# Patient Record
Sex: Male | Born: 1980 | Race: White | Hispanic: No | Marital: Married | Smoking: Current every day smoker
Health system: Southern US, Community
[De-identification: ages and names within clinical notes are randomized; demographics above are authoritative.]

## PROBLEM LIST (undated history)

## (undated) DIAGNOSIS — F1920 Other psychoactive substance dependence, uncomplicated: Secondary | ICD-10-CM

## (undated) DIAGNOSIS — F419 Anxiety disorder, unspecified: Secondary | ICD-10-CM

## (undated) HISTORY — DX: Anxiety disorder, unspecified: F41.9

---

## 2001-10-04 ENCOUNTER — Emergency Department (HOSPITAL_COMMUNITY): Admission: EM | Admit: 2001-10-04 | Discharge: 2001-10-05 | Payer: Self-pay | Admitting: Emergency Medicine

## 2002-02-06 ENCOUNTER — Emergency Department (HOSPITAL_COMMUNITY): Admission: EM | Admit: 2002-02-06 | Discharge: 2002-02-06 | Payer: Self-pay | Admitting: Emergency Medicine

## 2007-10-17 ENCOUNTER — Emergency Department (HOSPITAL_BASED_OUTPATIENT_CLINIC_OR_DEPARTMENT_OTHER): Admission: EM | Admit: 2007-10-17 | Discharge: 2007-10-17 | Payer: Self-pay | Admitting: Emergency Medicine

## 2007-12-04 ENCOUNTER — Ambulatory Visit: Payer: Self-pay | Admitting: Internal Medicine

## 2007-12-04 DIAGNOSIS — R079 Chest pain, unspecified: Secondary | ICD-10-CM | POA: Insufficient documentation

## 2007-12-04 DIAGNOSIS — F411 Generalized anxiety disorder: Secondary | ICD-10-CM | POA: Insufficient documentation

## 2007-12-04 LAB — CONVERTED CEMR LAB
Albumin: 4.5 g/dL (ref 3.5–5.2)
Alkaline Phosphatase: 56 units/L (ref 39–117)
BUN: 11 mg/dL (ref 6–23)
Bacteria, UA: NEGATIVE
Basophils Absolute: 0 10*3/uL (ref 0.0–0.1)
CRP, High Sensitivity: 1 (ref 0.00–5.00)
Crystals: NEGATIVE
Direct LDL: 137.7 mg/dL
Eosinophils Absolute: 0.1 10*3/uL (ref 0.0–0.7)
GFR calc Af Amer: 130 mL/min
GFR calc non Af Amer: 108 mL/min
HDL: 29.3 mg/dL — ABNORMAL LOW (ref >39.0)
Ketones, ur: NEGATIVE mg/dL
Leukocytes, UA: NEGATIVE
Lymphocytes Relative: 33 % (ref 12.0–46.0)
MCHC: 35.5 g/dL (ref 30.0–36.0)
MCV: 90.2 fL (ref 78.0–100.0)
Neutrophils Relative %: 58.3 % (ref 43.0–77.0)
Nitrite: NEGATIVE
Platelets: 280 10*3/uL (ref 150–400)
Potassium: 3.8 meq/L (ref 3.5–5.1)
RBC / HPF: NONE SEEN
RDW: 12.3 % (ref 11.5–14.6)
Specific Gravity, Urine: 1.02 (ref 1.000–1.03)
T3, Free: 3.3 pg/mL (ref 2.3–4.2)
Triglycerides: 247 mg/dL (ref 0–149)
Urobilinogen, UA: 0.2 (ref 0.0–1.0)
VLDL: 49 mg/dL — ABNORMAL HIGH (ref 0–40)

## 2007-12-06 ENCOUNTER — Encounter: Payer: Self-pay | Admitting: Internal Medicine

## 2007-12-07 ENCOUNTER — Encounter: Payer: Self-pay | Admitting: Internal Medicine

## 2007-12-11 ENCOUNTER — Telehealth: Payer: Self-pay | Admitting: Internal Medicine

## 2007-12-13 ENCOUNTER — Encounter: Payer: Self-pay | Admitting: Internal Medicine

## 2007-12-24 ENCOUNTER — Ambulatory Visit: Payer: Self-pay | Admitting: Internal Medicine

## 2007-12-24 DIAGNOSIS — F17200 Nicotine dependence, unspecified, uncomplicated: Secondary | ICD-10-CM | POA: Insufficient documentation

## 2009-06-17 ENCOUNTER — Ambulatory Visit: Payer: Self-pay | Admitting: Internal Medicine

## 2009-06-17 DIAGNOSIS — M79609 Pain in unspecified limb: Secondary | ICD-10-CM | POA: Insufficient documentation

## 2009-06-18 ENCOUNTER — Encounter: Payer: Self-pay | Admitting: Internal Medicine

## 2010-01-22 ENCOUNTER — Ambulatory Visit: Payer: Self-pay | Admitting: Internal Medicine

## 2010-01-22 DIAGNOSIS — E785 Hyperlipidemia, unspecified: Secondary | ICD-10-CM | POA: Insufficient documentation

## 2010-01-22 LAB — CONVERTED CEMR LAB
ALT: 21 units/L (ref 0–53)
AST: 19 units/L (ref 0–37)
BUN: 13 mg/dL (ref 6–23)
Bilirubin, Direct: 0.1 mg/dL (ref 0.0–0.3)
Calcium: 10 mg/dL (ref 8.4–10.5)
Cholesterol: 233 mg/dL — ABNORMAL HIGH (ref 0–200)
Glucose, Bld: 90 mg/dL (ref 70–99)
Indirect Bilirubin: 0.9 mg/dL (ref 0.0–0.9)
Sodium: 139 meq/L (ref 135–145)

## 2010-01-25 ENCOUNTER — Telehealth: Payer: Self-pay | Admitting: Internal Medicine

## 2010-01-25 ENCOUNTER — Encounter (INDEPENDENT_AMBULATORY_CARE_PROVIDER_SITE_OTHER): Payer: Self-pay | Admitting: *Deleted

## 2010-03-23 ENCOUNTER — Ambulatory Visit: Payer: Self-pay | Admitting: Internal Medicine

## 2010-03-23 DIAGNOSIS — L259 Unspecified contact dermatitis, unspecified cause: Secondary | ICD-10-CM | POA: Insufficient documentation

## 2010-03-23 DIAGNOSIS — G4700 Insomnia, unspecified: Secondary | ICD-10-CM | POA: Insufficient documentation

## 2010-03-23 LAB — CONVERTED CEMR LAB
BUN: 16 mg/dL (ref 6–23)
CO2: 26 meq/L (ref 19–32)
Chloride: 103 meq/L (ref 96–112)
Creatinine, Ser: 0.88 mg/dL (ref 0.40–1.50)
HDL: 31 mg/dL — ABNORMAL LOW (ref >39)
Hgb A1c MFr Bld: 5.5 % (ref <5.7)
LDL Cholesterol: 71 mg/dL (ref 0–99)

## 2010-03-26 ENCOUNTER — Encounter: Payer: Self-pay | Admitting: Internal Medicine

## 2010-05-25 NOTE — Assessment & Plan Note (Signed)
Summary: CPX/DT   Vital Signs:  Patient profile:   30 year old male Height:      71 inches Weight:      231.75 pounds O2 Sat:      96 % on Room air Temp:     98.7 degrees F oral Pulse rate:   108 / minute Pulse rhythm:   irregular Resp:     18 per minute BP sitting:   134 / 70  (right arm) Cuff size:   large  Vitals Entered By: Glendell Docker CMA (January 22, 2010 2:20 PM)  O2 Flow:  Room air CC: CPX Is Patient Diabetic? No Pain Assessment Patient in pain? no      Comments non fasting   Primary Care [place]:  Dondra Spry DO  CC:  CPX.  History of Present Illness: 30 y/o male for routine cpx lost job  stopped meds in November 2010   chronically feels anxious constant anxiety  some headaches - sometimes can be severe enough to want to lay down occ feels flushed occ loose 1 caffeinated drinks  no hx of trauma    good response to sertraline in the past occ got mood swings. 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    Physical Exam  General:  alert, well-developed, and well-nourished.   Head:  normocephalic and atraumatic.   Eyes:  pupils equal, pupils round, and pupils reactive to light.     Ears:  R ear normal and L ear normal.   Mouth:  good dentition and pharynx pink and moist.   Neck:  supple, no masses, and no carotid bruits.   Lungs:  normal respiratory effort and normal breath sounds.   Heart:  normal rate, regular rhythm, and no gallop.   Abdomen:  soft, non-tender, normal bowel sounds, no hepatomegaly, and no splenomegaly.   Extremities:  No lower extremity edema  Psych:  normally interactive, good eye contact, not anxious appearing, and not depressed appearing.     Impression & Recommendations:  Problem # 1:  PREVENTIVE HEALTH CARE (ICD-V70.0) Reviewed adult health maintenance protocols.  Td Booster: given (11/29/2004)   Flu Vax: Declined (01/22/2010)   Chol: 210 (12/04/2007)   HDL: 29.3 (12/04/2007)   LDL: DEL (12/04/2007)   TG: 247 (12/04/2007) TSH: 1.82 (12/04/2007)     Problem # 2:  ANXIETY DISORDER (ICD-300.00)  Orders: T-Basic Metabolic Panel [id number]) T-TSH [phone]) T-T4, Free [phone]) T- * Misc. Laboratory test [id number]) Psychology Referral (Psychology)  His updated medication list for this problem includes:    Amitriptyline Hcl 10 Mg Tabs (Amitriptyline hcl) ..... One to two tabs by mouth at bedtime as needed for sleep  Complete Medication List: 1)  Amitriptyline Hcl  10 Mg Tabs (Amitriptyline hcl) .... One to two tabs by mouth at bedtime as needed for sleep  Other Orders: T-Lipid Profile ([id number]) T-Hepatic Function [id number])  Patient Instructions: 1)  Please schedule a follow-up appointment in 2 months. 2)  Take fish oil capsule two times a day  3)  Try taking melatonin as directed for insomnia 4)  Also try taking amitriptyline for insomnia Prescriptions: AMITRIPTYLINE HCL 10 MG TABS (AMITRIPTYLINE HCL) one to two tabs by mouth at bedtime as needed for sleep  #60 x 2   Entered and Authorized by:   D. Thomos Lemons DO   Signed by:   D. Thomos Lemons DO on 01/22/2010   Method used:   Electronically to        [id number]* (retail)       [address]       [place], [place]  [address]       Ph: [phone]       Fax: [phone]   RxID:   [id number]    Immunization History:  Influenza Immunization History:    Influenza:  declined (01/22/2010)   Contraindications/Deferment of Procedures/Staging:    Test/Procedure: FLU VAX    Reason for deferment: patient declined   Current Allergies (reviewed today): No known allergies

## 2010-05-25 NOTE — Letter (Signed)
   [place] at [facility] [address] [place], [place]  [address]  [place] Phone: [phone]      March 26, 2010   Jordan Pugh [address] [place], [place] [address]  RE:  LAB RESULTS  Dear  [name],  The following is an interpretation of your most recent lab tests.  Please take note of any instructions provided or changes to medications that have resulted from your lab work.  ELECTROLYTES:  Good - no changes needed  KIDNEY FUNCTION TESTS:  Good - no changes needed  LIPID PANEL:  Fair - review at your next visit Triglyceride: 355   Cholesterol: 173   LDL: 71   HDL: 31   Chol/HDL%:  5.6 Ratio   DIABETIC STUDIES:  Good - no changes needed Blood Glucose: 101   HgbA1C: 5.5     CRP - 1.3 (normal)   Your triglycerides are still elevated.    Please avoid fried foods (fast food) and refined sugars (especially high fructose corn syrup).  Regular exercise can also help lower triglyceride levels.     Sincerely Yours,    Dr. Thomos Lemons  Appended Document:  mailed

## 2010-05-25 NOTE — Letter (Signed)
Summary: Primary Care Consult Scheduled Letter  [place] at [facility]  [address]   [place], [place] [address]   Phone: [phone]  Fax: [phone]      01/25/2010 MRN: [id number]  HAKAN Chrisley [address] [place], [place]  [address]    Dear Mr. Gielow,      We have scheduled an appointment for you.  At the recommendation of Dr.YOO, we have scheduled you a consult with [facility]  on _[address]  1 2011 at 4:30PM.  Their address [phone], [place] . The office phone number is [phone].  If this appointment day and time is not convenient for you, please feel free to call the office of the doctor you are being referred to at the number listed above and reschedule the appointment.     It is important for you to keep your scheduled appointments. We are here to make sure you are given good patient care.    Thank you,  Darral Dash Patient Care Coordinator [place] at [facility]

## 2010-05-25 NOTE — Consult Note (Signed)
Summary: [facility]  [facility]   Imported By: Lanelle Bal 07/01/2009 13:52:52  _____________________________________________________________________  External Attachment:    Type:   Image     Comment:   External Document

## 2010-05-25 NOTE — Assessment & Plan Note (Signed)
Summary: 2 month fu/dt   Vital Signs:  Patient profile:   30 year old male Height:      71 inches Weight:      227 pounds BMI:     31.77 O2 Sat:      97 % on Room air Temp:     98.3 degrees F oral Pulse rate:   96 / minute Resp:     18 per minute BP sitting:   132 / 80  (right arm) Cuff size:   large  Vitals Entered By: Glendell Docker CMA (March 23, 2010 9:07 AM)  O2 Flow:  Room air CC: 2 month follow up  Is Patient Diabetic? No Pain Assessment Patient in pain? no      Comments fasting for blood work, no concerns   Primary Care [place]:  D. Thomos Lemons DO  CC:  2 month follow up .  History of Present Illness:  Hyperlipidemia Follow-Up      This is a 30 year old man who presents for Hyperlipidemia follow-up.  The patient denies the following symptoms: chest pain/pressure.  Dietary compliance has been good and fair.  Adjunctive measures currently used by the patient include fish oil supplements.    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   Neck:  supple, no masses, and no carotid bruits.   Lungs:  normal respiratory effort and normal breath sounds.   Heart:  normal rate, regular rhythm, and no gallop.     Impression & Recommendations:  Problem # 1:  DYSLIPIDEMIA (ICD-272.4) repeat lipids. continue dietary / lifestyle changes rule out diabetes thyroid function normal no regular alcohol use  Labs Reviewed: SGOT: 19 (01/22/2010)   SGPT: 21 (01/22/2010)   HDL:30  (01/22/2010), 29.3 (12/04/2007)  LDL:See Comment mg/dL (16/01/9603), DEL (54/12/8117)  Chol:233 (01/22/2010), 210 (12/04/2007)  Trig:836 (01/22/2010), 247 (12/04/2007)  Problem # 2:  INSOMNIA, CHRONIC (ICD-307.42) Assessment: Improved good response to amitriptyline  he has been take two 10 mg tabs switch to 25 mg  Complete Medication List: 1)  Amitriptyline Hcl 25 Mg Tabs (Amitriptyline hcl) .... One by mouth at bedtime prn 2)  Fish Oil 1000 Mg Caps (Omega-3 fatty acids) .... One by mouth two times a day  Other Orders: T-Basic Metabolic Panel [id number]) T-Lipid Profile [id number]) CRP, high sensitivity-[facility] [phone]) T- Hemoglobin A1C ([id number])  Patient Instructions: 1)  Please schedule a follow-up appointment in 6 months. 2)  Take fish oil caps as directed Prescriptions: AMITRIPTYLINE HCL 25 MG TABS (AMITRIPTYLINE HCL) one by mouth at bedtime prn  #30 x 5   Entered and Authorized by:   D. Thomos Lemons DO   Signed by:   D. Thomos Lemons DO on 03/23/2010   Method used:   Electronically to        [facility] [phone]* (retail)       [address]       [place], [place]  [address]  Ph: [phone]       Fax: [phone]   RxID:   [id number]    Orders Added: 1)  T-Basic Metabolic Panel [phone] 2)  T-Lipid Profile [[id number]] 3)  CRP, high sensitivity-[facility] [id number] 4)  T- Hemoglobin A1C [[id number]] 5)  Est. 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 Thomos Lemons DO;  Authorized by: D. Thomos Lemons DO;  Method used: Electronically to [facility] [phone]*, [address] [place], [place], [place]  [address], Ph: [phone], Fax: [phone]    Prescriptions: CLOTRIMAZOLE-BETAMETHASONE 1-0.05 % CREA (CLOTRIMAZOLE-BETAMETHASONE) apply two times a day to hands x 2 weeks  #30 grams x 1   Entered and Authorized by:   D. Thomos Lemons DO   Signed by:   D. Thomos Lemons DO on 03/23/2010   Method used:   Electronically to        [id number]* (retail)       [address]       [place], [place]  [address]       Ph: [phone]       Fax: [phone]   RxID:   [id number]    Current Allergies: No known allergies

## 2010-05-25 NOTE — Letter (Signed)
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 [address]   [place], [place] [address]   Phone: [phone]  Fax: [phone]      June 17, 2009     Employee:  Jordan Pugh    To Whom It May Concern:   For Medical reasons, please excuse the above named employee from work for the following dates:  Start:   June 17, 2009  End:   June 21, 2009  He may resume normal work schedule on Monday June 22, 2009.If you need additional information, please feel free to contact our office.           Sincerely,    Glendell Docker CMA Dr. Thomos Lemons

## 2010-05-25 NOTE — Progress Notes (Signed)
Summary: Lab Work  Programme researcher, broadcasting/film/video of Call: call pt - was he fasting for recent blood test? Initial call taken by: D. Thomos Lemons DO,  January 25, 2010 1:19 PM  Follow-up for Phone Call        call placed to patient at [phone], no answer. A detailed voice message was left for patient to return call to inform if he was fasting for his blood work Follow-up by: Glendell Docker CMA,  January 25, 2010 4:58 PM  Additional Follow-up for Phone Call Additional follow up Details #1::        patient returned call and left voice message stating he last ate at 9am prior to blood draw Additional Follow-up by: Glendell Docker CMA,  January 26, 2010 8:48 AM    Additional Follow-up for Phone Call Additional follow up Details #2::    we need to repeat FLP.  plz make sure pt fasting 10 hrs before blood test also add High sensitivity CRP :  272.4  Follow-up by: D. Thomos Lemons DO,  January 26, 2010 2:42 PM  Additional Follow-up for Phone Call Additional follow up Details #3:: Details for Additional Follow-up Action Taken: call placed to patient at [phone], no answer. A detailed voice message was left informing patient per Dr Artist Pais instructions. He was advised to call back to inform what day he will go for the lab draw Additional Follow-up by: Glendell Docker CMA,  January 26, 2010 3:01 PM

## 2010-05-25 NOTE — Assessment & Plan Note (Signed)
Summary: PAIN IN RIGHT ARM/MHF   Vital Signs:  Patient profile:   30 year old male Weight:      215.25 pounds BMI:     30.13 O2 Sat:      100 % on 0.25 L/min Temp:     98.0 degrees F Pulse rate:   70 / minute Pulse rhythm:   regular Resp:     16 per minute BP sitting:   124 / 80  (left arm) Cuff size:   large  Vitals Entered By: Glendell Docker CMA (June 17, 2009 10:26 AM)  O2 Flow:  0.25 L/min CC: RM 4-RIght arm pain Pain Assessment Patient in pain? yes     Location: arm Intensity: 6 Type: stinging Onset of pain  With activity Comments patient states he has had right arm pain that started last Friday. He states the pain radiates up and down arm, and up to neck and jaw, pain is dependent upon movement, dull ache-stinging stabbing sensation with movement   Primary Care Provider:  Dondra Spry DO  CC:  RM 4-RIght arm pain.  History of Present Illness: onset - 5 days ago.  started out dull pain in forearm.  got worse.  sharp stabbing pain upper arm and neck. no fall injury.  lifts ladders daily  Allergies (verified): No Known Drug Allergies  Past History:  Past Medical History: Childhood asthma Panic disorder/anxiety   Family History: Father died at age 58 due to myocardial infarction.  Father is known to abuse cocaine and alcohol Mother is 8 and healthy. Grandfather died of COPD and lung tumor    Social History: Occupation: Engineer, maintenance (IT) Married - no children  Current Smoker  (currently smokes one half pack per day.  He has been smoking for 12 years) Alcohol use-yes  (social) 2 to 3 caffeinated beverages per day   Physical Exam  General:  alert, well-developed, and well-nourished.   Lungs:  normal respiratory effort and normal breath sounds.   Heart:  normal rate, regular rhythm, and no gallop.   Msk:  right shoulder AC joint tenderness.  limited abduction of rt arm Neurologic:  cranial nerves II-XII intact and DTRs symmetrical and  normal.     Impression & Recommendations:  Problem # 1:  ARM PAIN, RIGHT (ICD-729.5) Pt with progressive right arm pain.  difficult to discern from exam whether left arm weakness from pain response vs cervical radiculopathy.  refer to ortho for further eval.  defer MRI of Neck to ortho.   continue NSAIDs OTC.  use skelaxin.  out of work - no lifting until cleared ortho Orders: Orthopedic Referral (Ortho)  Complete Medication List: 1)  Metaxalone 800 Mg Tabs (Metaxalone) .... One by mouth three times a day as needed  Patient Instructions: 1)  Please schedule a follow-up appointment in 1 week. Prescriptions: METAXALONE 800 MG TABS (METAXALONE) one by mouth three times a day as needed  #21 x 0   Entered and Authorized by:   D. Thomos Lemons DO   Signed by:   D. Thomos Lemons DO on 06/17/2009   Method used:   Electronically to        [facility]  [address] [address] [phone]* (retail)       [address]       [place], [place]  [address]       Ph: [phone] or [phone]       Fax: [phone]   RxID:  [id number]    Orders Added: 1)  Orthopedic Referral [Ortho] 2)  Est. Patient Level III [[address]]   Current Allergies (reviewed today): No known allergies    Immunization History:  Influenza Immunization History:    Influenza:  declined (06/17/2009)   Contraindications/Deferment of Procedures/Staging:    Test/Procedure: FLU VAX    Reason for deferment: patient declined

## 2010-06-25 ENCOUNTER — Encounter: Payer: Self-pay | Admitting: Internal Medicine

## 2010-08-16 ENCOUNTER — Ambulatory Visit (INDEPENDENT_AMBULATORY_CARE_PROVIDER_SITE_OTHER): Payer: 59 | Admitting: Family

## 2010-08-16 DIAGNOSIS — J329 Chronic sinusitis, unspecified: Secondary | ICD-10-CM | POA: Insufficient documentation

## 2010-08-16 MED ORDER — AMOXICILLIN 875 MG PO TABS: 875.0000 mg | ORAL_TABLET | Freq: Two times a day (BID) | ORAL | Status: AC

## 2010-08-16 NOTE — Patient Instructions (Signed)

## 2010-08-16 NOTE — Assessment & Plan Note (Addendum)
Will plan to treat for sinusitis with amoxicillin. Pt instructed to call if symptoms worsen or if they do not improve.

## 2010-08-16 NOTE — Progress Notes (Signed)
  Subjective:    Patient ID: Jordan Pugh, male    DOB: [birth year], 30 y.o.   MRN: [id number]  HPI Mr.  Schoch is a 30 yr old male who presents with chief complaint of sinus pressure.  + nasal congestion- blood tinged at times.  + pressure behind the eyes.  Denies associated fever, but feels that his energy is low.  He has tried dayquil/nyquil, mucinex without much improvement.  Symptoms started 5 days ago.  + mild cough Review of Systems See HPI    Past Medical History  Diagnosis Date  . Asthma     childhood  . Anxiety     anxiety and panic disorder    History   Social History  . Marital Status: Married    Spouse Name: N/A    Number of Children: N/A  . Years of Education: N/A   Occupational History  . 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 Smoking status: Current Everyday Smoker  . Smokeless tobacco: Not on file  . Alcohol Use: Yes  . Drug Use:   . Sexually Active:    Other Topics Concern  . Not on file   Social History Narrative   Wife is expecting in May 2012Caffeine Use:  2-3 beverages daily    No past surgical history on file.  Family History  Problem Relation Age of Onset  . Alcohol abuse Father   . Drug abuse Father   . Heart disease Father     MI    Allergies not on file  Current Outpatient Prescriptions on File Prior to Visit  Medication Sig Dispense Refill  . fish oil-omega-3 fatty acids 1000 MG capsule Take 2 g by mouth 2 (two) times daily.        Marland Kitchen DISCONTD: amitriptyline (ELAVIL) 25 MG tablet Take 25 mg by mouth at bedtime as needed.        Marland Kitchen DISCONTD: clotrimazole-betamethasone (LOTRISONE) cream Apply topically 2 (two) times daily. To hands x 2 weeks.         BP 126/90  Pulse 88  Temp 97.9 F (36.6 C)  SpO2 98%    Objective:   Physical Exam  Constitutional: He appears well-developed and well-nourished.  HENT:  Head: Normocephalic and atraumatic.  Right Ear: Tympanic membrane normal.  Left Ear: Tympanic  membrane normal.  Mouth/Throat: Uvula is midline and mucous membranes are normal. No oropharyngeal exudate or posterior oropharyngeal edema.  Eyes: Conjunctivae and EOM are normal. Pupils are equal, round, and reactive to light.  Neck: Normal range of motion. Neck supple.  Cardiovascular: Normal rate and regular rhythm.   Pulmonary/Chest: Effort normal and breath sounds normal. No respiratory distress. He has no wheezes. He has no rales.  Abdominal: Soft. Bowel sounds are normal. He exhibits no distension. There is no tenderness.          Assessment & Plan:

## 2010-08-18 ENCOUNTER — Telehealth: Payer: Self-pay | Admitting: Internal Medicine

## 2010-08-18 NOTE — Telephone Encounter (Signed)
Patient states that he has an upcoming appt on 09-03-10. Patients wife is expecting baby and he would like to know if he could get a whooping cough shot at that same visit.

## 2010-08-18 NOTE — Telephone Encounter (Signed)
Call placed to patient at [phone], no answer. A detailed voice message was left for patient to call the health department to verify the type of vaccine that he is requesting. Message informed patient if TDAP was needed that could be provided at the office visit, otherwise he is to follow up with the health department for requested vaccine. He was advised to call back if any additional questions

## 2010-08-19 ENCOUNTER — Ambulatory Visit: Payer: Self-pay | Admitting: Internal Medicine

## 2010-09-02 ENCOUNTER — Encounter: Payer: Self-pay | Admitting: Internal Medicine

## 2010-09-03 ENCOUNTER — Ambulatory Visit: Payer: Self-pay | Admitting: Internal Medicine

## 2011-03-03 ENCOUNTER — Encounter (HOSPITAL_BASED_OUTPATIENT_CLINIC_OR_DEPARTMENT_OTHER): Payer: Self-pay | Admitting: Emergency Medicine

## 2011-03-03 ENCOUNTER — Emergency Department (HOSPITAL_BASED_OUTPATIENT_CLINIC_OR_DEPARTMENT_OTHER)
Admission: EM | Admit: 2011-03-03 | Discharge: 2011-03-03 | Disposition: A | Discharge status: Home / Self Care | Payer: Managed Care, Other (non HMO) | Attending: Emergency Medicine | Admitting: Emergency Medicine

## 2011-03-03 DIAGNOSIS — F17200 Nicotine dependence, unspecified, uncomplicated: Secondary | ICD-10-CM | POA: Insufficient documentation

## 2011-03-03 DIAGNOSIS — S0180XA Unspecified open wound of other part of head, initial encounter: Secondary | ICD-10-CM | POA: Insufficient documentation

## 2011-03-03 DIAGNOSIS — J45909 Unspecified asthma, uncomplicated: Secondary | ICD-10-CM | POA: Insufficient documentation

## 2011-03-03 DIAGNOSIS — X58XXXA Exposure to other specified factors, initial encounter: Secondary | ICD-10-CM | POA: Insufficient documentation

## 2011-03-03 DIAGNOSIS — S0181XA Laceration without foreign body of other part of head, initial encounter: Secondary | ICD-10-CM

## 2011-03-03 MED ORDER — LIDOCAINE-EPINEPHRINE-TETRACAINE (LET) SOLUTION
3.0000 mL | Freq: Once | NASAL | Status: DC
  Filled 2011-03-03: qty 3

## 2011-03-03 NOTE — ED Provider Notes (Signed)
History     CSN: [id number] Arrival date & time: 03/03/2011  9:18 PM   First MD Initiated Contact with Patient 03/03/11 2243      Chief Complaint  Patient presents with  . Facial Laceration    laceration to R eyebrow fall on stairs bleeding controlled    (Consider location/radiation/quality/duration/timing/severity/associated sxs/prior treatment) HPI The patient presents immediately after incidental trauma to his right forehead with concerns of a right forehead laceration. He notes in significant pain, no loss of consciousness, no visual complaints, no nausea, no vomiting, no other complaints. Past Medical History  Diagnosis Date  . Asthma     childhood  . Anxiety     anxiety and panic disorder    History reviewed. No pertinent past surgical history.  Family History  Problem Relation Age of Onset  . Alcohol abuse Father   . Drug abuse Father     cocaine  . Heart disease Father     MI  . COPD Other   . Other Other     lung tumor    History  Substance Use Topics  . Smoking status: Current Everyday Smoker -- 0.0 packs/day  . Smokeless tobacco: Not on file  . Alcohol Use: Yes     social      Review of Systems  All other systems reviewed and are negative.    Allergies  Review of patient's allergies indicates no known allergies.  Home Medications   Current Outpatient Rx  Name Route Sig Dispense Refill  . CLONAZEPAM 1 MG PO TABS Oral Take 1 mg by mouth 2 (two) times daily.      . OMEGA-3 FATTY ACIDS 1000 MG PO CAPS Oral Take 2 g by mouth 2 (two) times daily.      Carma Leaven M PLUS PO TABS Oral Take 1 tablet by mouth daily.      . SERTRALINE HCL 100 MG PO TABS Oral Take 100 mg by mouth daily.        BP 135/75  Pulse 70  Temp(Src) 97.6 F (36.4 C) (Oral)  Resp 18  SpO2 98%  Physical Exam  Constitutional: He is oriented to person, place, and time. He appears well-developed and well-nourished. No distress.  HENT:  Head: Normocephalic.         There is  a half inch laceration vertically oriented within the right brow with the superior most third of an inch extending above the brow. Laceration is not full dermal depth. Not actively bleeding on exam.  Eyes: Conjunctivae and EOM are normal. Pupils are equal, round, and reactive to light.  Pulmonary/Chest: Effort normal.  Neurological: He is alert and oriented to person, place, and time. No cranial nerve deficit. Coordination normal.  Skin: Skin is warm and dry. No erythema.  Psychiatric: He has a normal mood and affect.    ED Course  Procedures (including critical care time)  Labs Reviewed - No data to display No results found.   No diagnosis found.    MDM  This and generally well-appearing male presents following mild trauma to his right for head following a fall on stairs. The laceration is not full dermal depth, is not actively bleeding on presentation. Following cleansing, and application of topical lidocaine, the wound continued not to bleed. The absence of significant depth, the minimal length, and the fact that the wound is within the patient's brow all are significant for the lack of appropriateness of suture repair. This was discussed at length with the  patient, who agrees. He will be discharged with sterile dressing, wound care restrictions, PMD followup.        Gerhard Munch, MD 03/03/11 2321

## 2011-03-03 NOTE — ED Notes (Signed)
MD at bedside for laceration repair.

## 2011-03-03 NOTE — ED Notes (Signed)
.  5 inch laceration to R brow pt denies any other injuries

## 2011-03-03 NOTE — ED Notes (Signed)
LET applied to Site

## 2012-10-24 ENCOUNTER — Ambulatory Visit (INDEPENDENT_AMBULATORY_CARE_PROVIDER_SITE_OTHER): Payer: BC Managed Care – PPO | Admitting: Physician Assistant

## 2012-10-24 VITALS — BP 134/82 | HR 75 | Temp 97.9°F | Resp 18 | Ht 72.5 in | Wt 215.0 lb

## 2012-10-24 DIAGNOSIS — J019 Acute sinusitis, unspecified: Secondary | ICD-10-CM

## 2012-10-24 DIAGNOSIS — H9202 Otalgia, left ear: Secondary | ICD-10-CM

## 2012-10-24 DIAGNOSIS — H9209 Otalgia, unspecified ear: Secondary | ICD-10-CM

## 2012-10-24 DIAGNOSIS — J029 Acute pharyngitis, unspecified: Secondary | ICD-10-CM

## 2012-10-24 DIAGNOSIS — H6692 Otitis media, unspecified, left ear: Secondary | ICD-10-CM

## 2012-10-24 DIAGNOSIS — H6690 Otitis media, unspecified, unspecified ear: Secondary | ICD-10-CM

## 2012-10-24 MED ORDER — AMOXICILLIN 500 MG PO TABS: 1000.0000 mg | ORAL_TABLET | Freq: Three times a day (TID) | ORAL | Status: DC

## 2012-10-24 MED ORDER — IPRATROPIUM BROMIDE 0.03 % NA SOLN: 2.0000 | Freq: Two times a day (BID) | NASAL | Status: DC

## 2012-10-24 NOTE — Progress Notes (Signed)
  Subjective:    Patient ID: Jordan Pugh, male    DOB: [birth year], 32 y.o.   MRN: [id number]  HPI 32 year old male presents with 2 week history of nasal congestion, sinus pressure, thick, nasal discharge, and PND.  Also states this morning his left ear became very painful. Pain radiates to jaw and down the left side of his neck.  He has taken ibuprofen which has helped slightly with the pain.  Also has been taking Mucinex and OTC sinus medications which have not helped much. Denies fever, chills, nausea, vomiting, sore throat, cough, hemoptysis, SOB, wheezing, or dizziness.  No significant history of frequent ear infections or sinus infections.     Review of Systems  Constitutional: Negative for fever and chills.  HENT: Positive for ear pain (left sided), congestion, rhinorrhea, postnasal drip and sinus pressure. Negative for sore throat.   Respiratory: Negative for cough, shortness of breath and wheezing.   Cardiovascular: Negative for chest pain.  Gastrointestinal: Negative for nausea, vomiting and abdominal pain.  Neurological: Negative for dizziness and headaches.       Objective:   Physical Exam  HENT:  Head: Normocephalic and atraumatic.  Right Ear: Hearing, tympanic membrane, external ear and ear canal normal.  Left Ear: Hearing, external ear and ear canal normal. No drainage. Tympanic membrane is erythematous. Tympanic membrane is not perforated.  Mouth/Throat: Oropharynx is clear and moist. No oropharyngeal exudate.  Eyes: Conjunctivae are normal.  Neck: Normal range of motion. Neck supple.  Cardiovascular: Normal rate, regular rhythm and normal heart sounds.   Pulmonary/Chest: Effort normal and breath sounds normal.  Lymphadenopathy:    He has no cervical adenopathy.  Psychiatric: He has a normal mood and affect. His behavior is normal. Judgment and thought content normal.          Assessment & Plan:  Otalgia of left ear - Plan: amoxicillin (AMOXIL) 500 MG  tablet  Otitis media, left  Sinusitis, acute - Plan: ipratropium (ATROVENT) 0.03 % nasal spray  Sore throat  Start amoxicillin 1G tid x 10 days Atrovent NS to help with congestion and ETD Continue Mucinex Follow up if symptoms worsen or fail to improve.

## 2015-04-01 ENCOUNTER — Ambulatory Visit (INDEPENDENT_AMBULATORY_CARE_PROVIDER_SITE_OTHER): Payer: Worker's Compensation | Admitting: Family Medicine

## 2015-04-01 VITALS — BP 126/78 | HR 68 | Temp 98.8°F | Resp 16 | Ht 69.5 in | Wt 229.8 lb

## 2015-04-01 DIAGNOSIS — H109 Unspecified conjunctivitis: Secondary | ICD-10-CM

## 2015-04-01 DIAGNOSIS — T1591XA Foreign body on external eye, part unspecified, right eye, initial encounter: Secondary | ICD-10-CM

## 2015-04-01 MED ORDER — CIPROFLOXACIN HCL 0.3 % OP SOLN: 1.0000 [drp] | OPHTHALMIC | Status: DC

## 2015-04-01 NOTE — Patient Instructions (Addendum)
You likely had a foreign body to the eye that is now out, but some signs of conjunctivitis/infection currently.  Start the antibiotic drops, avoid contacts for at least 1 week, or until all eye symptoms have resolved.  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  Recheck in 2 days.  Return to the clinic or go to the nearest emergency room if any of your symptoms worsen or new symptoms occur.  Bacterial Conjunctivitis Bacterial conjunctivitis, commonly called pink eye, is an inflammation of the clear membrane that covers the white part of the eye (conjunctiva). The inflammation can also happen on the underside of the eyelids. The blood vessels in the conjunctiva become inflamed, causing the eye to become red or pink. Bacterial conjunctivitis may spread easily from one eye to another and from person to person (contagious).  CAUSES  Bacterial conjunctivitis is caused by bacteria. The bacteria may come from your own skin, your upper respiratory tract, or from someone else with bacterial conjunctivitis. SYMPTOMS  The normally white color of the eye or the underside of the eyelid is usually pink or red. The pink eye is usually associated with irritation, tearing, and some sensitivity to light. Bacterial conjunctivitis is often associated with a thick, yellowish discharge from the eye. The discharge may turn into a crust on the eyelids overnight, which causes your eyelids to stick together. If a discharge is present, there may also be some blurred vision in the affected eye. DIAGNOSIS  Bacterial conjunctivitis is diagnosed by your caregiver through an eye exam and the symptoms that you report. Your caregiver looks for changes in the surface tissues of your eyes, which may point to the specific type of conjunctivitis. A sample of any discharge may be collected on a cotton-tip swab if you have a severe case of conjunctivitis, if your cornea is affected, or if you keep  getting repeat infections that do not respond to treatment. The sample will be sent to a lab to see if the inflammation is caused by a bacterial infection and to see if the infection will respond to antibiotic medicines. TREATMENT   Bacterial conjunctivitis is treated with antibiotics. Antibiotic eyedrops are most often used. However, antibiotic ointments are also available. Antibiotics pills are sometimes used. Artificial tears or eye washes may ease discomfort. HOME CARE INSTRUCTIONS   To ease discomfort, apply a cool, clean washcloth to your eye for 10-20 minutes, 3-4 times a day.  Gently wipe away any drainage from your eye with a warm, wet washcloth or a cotton ball.  Wash your hands often with soap and water. Use paper towels to dry your hands.  Do not share towels or washcloths. This may spread the infection.  Change or wash your pillowcase every day.  You should not use eye makeup until the infection is gone.  Do not operate machinery or drive if your vision is blurred.  Stop using contact lenses. Ask your caregiver how to sterilize or replace your contacts before using them again. This depends on the type of contact lenses that you use.  When applying medicine to the infected eye, do not touch the edge of your eyelid with the eyedrop bottle or ointment tube.  SEEK IMMEDIATE MEDICAL CARE IF:   Your infection has not improved within 3 days after beginning treatment.  You had yellow discharge from your eye and it returns.  You have increased eye pain.  Your eye redness is spreading.  Your vision  becomes blurred.  You have a fever or persistent symptoms for more than 2-3 days.  You have a fever and your symptoms suddenly get worse.  You have facial pain, redness, or swelling. MAKE SURE YOU:   Understand these instructions.  Will watch your condition.  Will get help right away if you are not doing well or get worse.   This information is not intended to replace  advice given to you by your health care provider. Make sure you discuss any questions you have with your health care provider.   Document Released: 04/11/2005 Document Revised: 05/02/2014 Document Reviewed: 09/12/2011 Elsevier Interactive Patient Education 2016 [employer]  Eye Foreign Body A foreign body refers to any object on the surface of the eye or in the eyeball that should not be there. A foreign body may be a small speck of dirt or dust, a hair or eyelash, a splinter, or any other object.  SIGNS AND SYMPTOMS Symptoms depend on what the foreign body is and where it is in the eye. The most common locations are:   On the inner surface of the upper or lower eyelids or on the covering of the white part of the eye (conjunctiva). Symptoms in this location are:  Pain and irritation, especially when blinking.  The feeling that something is in the eye.  On the surface of the clear covering on the front of the eye (cornea). Symptoms in this location include:  Pain and irritation.   Small "rust rings" around a metallic foreign body.  The feeling that something is in the eye.   Inside the eyeball. Foreign bodies inside the eye may cause:   Great pain.   Immediate loss of vision.   Distortion of the pupil. DIAGNOSIS  Foreign bodies are found during an exam by an eye specialist. Those on the eyelids, conjunctiva, or cornea are usually (but not always) easily found. When a foreign body is inside the eyeball, a cloudiness of the lens (cataract) may form almost right away. This makes it hard for an eye specialist to find the foreign body. Tests may be needed, including ultrasound testing, X-rays, and CT scans. TREATMENT   Foreign bodies on the eyelids, conjunctiva, or cornea are often removed easily and painlessly.  Rust in the cornea may require the use of a drill-like instrument to remove the rust.  If the foreign body has caused a scratch or a rubbing or scraping (abrasion)  of the cornea, this may be treated with antibiotic drops or ointment. A pressure patch may be put over your eye.  If the foreign body is inside your eyeball, surgery is needed right away. This is a medical emergency. Foreign bodies inside the eye threaten vision. A person may even lose his or her eye. HOME CARE INSTRUCTIONS   Take medicines only as directed by your health care provider. Use eye drops or ointment as directed.  If no eye patch was applied:  Keep your eye closed as much as possible.  Do not rub your eye.  Wear dark glasses as needed to protect your eyes from bright light.  Do not wear contact lenses until your eye feels normal again, or as instructed by your health care provider.  Wear a protective eye covering if there is a risk of eye injury. This is important when working with high-speed tools.  If your eye is patched:  Follow your health care provider's instructions for when to remove the patch.  Do notdrive or operate machinery  if your eye is patched. Your ability to judge distances is impaired.  Keep all follow-up visits as directed by your health care provider. This is important. SEEK MEDICAL CARE IF:   You have increased pain in your eye.  Your vision gets worse.   You have problems with your eye patch.   You have fluid (discharge) coming from your injured eye.   You have redness and swelling around your affected eye.  MAKE SURE YOU:   Understand these instructions.  Will watch your condition.  Will get help right away if you are not doing well or get worse.   This information is not intended to replace advice given to you by your health care provider. Make sure you discuss any questions you have with your health care provider.   Document Released: 04/11/2005 Document Revised: 05/02/2014 Document Reviewed: 09/06/2012 Elsevier Interactive Patient Education [employer]

## 2015-04-01 NOTE — Progress Notes (Addendum)
Subjective:  By signing my name below, I, Raven Small, attest that this documentation has been prepared under the direction and in the presence of Jordan Staggers, MD.  Electronically Signed: Andrew Au, ED Scribe. 04/01/2015. 12:00 PM.  Patient ID: Jordan Pugh, male    DOB: 06-21-1980, 34 y.o.   MRN: [id number]  HPI   Chief Complaint  Patient presents with  . Eye Pain    right, redness and swelling. pt got a piece of metal in eye from work.   HPI Comments: Jordan Pugh is a 34 y.o. male who presents to the [facility] for an right eye injury that occurred at work, 2 days ago.  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 While at 2 days ago, around 8pm, he felt as if a shaving flew into right eye while hand filing a cylinder. Pt was wearing safety goggles at that time. Afterwards he removed his contact, went to eye wash station and flushed right eye, no foreign body noticed. For the past 2 days, pt has had right eye irritation, redness and some pus-like drainage from right eye through out the day. States drainage will occasionally get into right eye causing blurry vision, but no visual disturbances otherwise. He has been flushing eye frequently. Pt had contacts in for about 1 week at that time, and states he changes them every 2 weeks.Marland Kitchen He denies exposure to pink eye.no fever, chill, cough congestion and rhinorrhea.   Review of Systems  Constitutional: Negative for fever and chills.  HENT: Negative for congestion and rhinorrhea.   Eyes: Positive for pain, discharge and redness. Negative for visual disturbance.  Respiratory: Negative for cough.    Objective:   Physical Exam  Constitutional: He is oriented to person, place, and time. He appears well-developed and well-nourished. No distress.  HENT:  Head: Normocephalic and atraumatic.  Right Ear: Tympanic membrane, external ear and ear canal normal.  Left Ear:  Tympanic membrane, external ear and ear canal normal.  Nose: No rhinorrhea.  Mouth/Throat: Oropharynx is clear and moist and mucous membranes are normal. No oropharyngeal exudate or posterior oropharyngeal erythema.  Eyes: Conjunctivae and EOM are normal. Pupils are equal, round, and reactive to light. Right eye exhibits no nystagmus. Left eye exhibits no nystagmus.  Right diffuse scleral injection. No apparent foregin body on gross inspection. Minimal discharge at medial canthus. Anterior chamber clear,  Proparacaine 2 gtts applied,with less soreness after gtts. Lids everted, no foreign body identified. Fluorescein applied, no uptake. Negative seidel sign.  Flushed with sterile water, no complications.    Neck: Neck supple.  Cardiovascular: Normal rate, regular rhythm, normal heart sounds and intact distal pulses.  Exam reveals no gallop and no friction rub.   No murmur heard. Pulmonary/Chest: Effort normal and breath sounds normal. He has no wheezes. He has no rhonchi. He has no rales.  Abdominal: Soft. There is no tenderness.  Musculoskeletal: Normal range of motion.  Lymphadenopathy:       Head (right side): No preauricular adenopathy present.       Head (left side): No preauricular adenopathy present.    He has no cervical adenopathy.  Neurological: He is alert and oriented to person, place, and time.  Skin: Skin is warm and dry. No rash noted.  Psychiatric: He has a normal mood and affect. His behavior is normal.  Nursing note and vitals reviewed.   Filed Vitals:   04/01/15 1111  BP: 126/78  Pulse: 68  Temp: 98.8 F (37.1 C)  TempSrc: Oral  Resp: 16  Height: 5' 9.5" (1.765 m)  Weight: 229 lb 12.8 oz (104.237 kg)  SpO2: 98%    Visual Acuity Screening   Right eye Left eye Both eyes  Without correction:     With correction: 20/30 20/25 20/25     Assessment & Plan:   Jordan LeakRobert N Severa is a 34 y.o. male Conjunctivitis of right eye - Plan: ciprofloxacin (CILOXAN) 0.3 %  ophthalmic solution  FB eye, right, initial encounter - Plan: ciprofloxacin (CILOXAN) 0.3 % ophthalmic solution  Suspected initial foreign body due to injury at work on 03/30/15 that is now out/removed as no evidence of FB or abrasion on exam. Possible secondary conjunctivitis from initial FB.   -start ciloxan gtts, no contact lenses for now, protective eyewear at work (shield or goggles may provide improved coverage from shavings).   -recheck in 2 days - sooner if worse. If not improving - consider optho eval.   Meds ordered this encounter  . ciprofloxacin (CILOXAN) 0.3 % ophthalmic solution    Sig: Place 1 drop into the right eye every 2 (two) hours. Administer 1 drop, every 2 hours, while awake, for 2 days. Then 1 drop, every 4 hours, while awake, for the next 5 days.    Dispense:  5 mL    Refill:  0   Patient Instructions  You likely had a foreign body to the eye that is now out, but some signs of conjunctivitis/infection currently.  Start the antibiotic drops, avoid contacts for at least 1 week, or until all eye symptoms have resolved.  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  Recheck in 2 days.  Return to the clinic or go to the nearest emergency room if any of your symptoms worsen or new symptoms occur.  Bacterial Conjunctivitis Bacterial conjunctivitis, commonly called pink eye, is an inflammation of the clear membrane that covers the white part of the eye (conjunctiva). The inflammation can also happen on the underside of the eyelids. The blood vessels in the conjunctiva become inflamed, causing the eye to become red or pink. Bacterial conjunctivitis may spread easily from one eye to another and from person to person (contagious).  CAUSES  Bacterial conjunctivitis is caused by bacteria. The bacteria may come from your own skin, your upper respiratory tract, or from someone else with bacterial conjunctivitis. SYMPTOMS  The normally white  color of the eye or the underside of the eyelid is usually pink or red. The pink eye is usually associated with irritation, tearing, and some sensitivity to light. Bacterial conjunctivitis is often associated with a thick, yellowish discharge from the eye. The discharge may turn into a crust on the eyelids overnight, which causes your eyelids to stick together. If a discharge is present, there may also be some blurred vision in the affected eye. DIAGNOSIS  Bacterial conjunctivitis is diagnosed by your caregiver through an eye exam and the symptoms that you report. Your caregiver looks for changes in the surface tissues of your eyes, which may point to the specific type of conjunctivitis. A sample of any discharge may be collected on a cotton-tip swab if you have a severe case of conjunctivitis, if your cornea is affected, or if you keep getting repeat infections that do not respond to treatment. The sample will be sent to a lab to see if the inflammation is caused by a bacterial  infection and to see if the infection will respond to antibiotic medicines. TREATMENT   Bacterial conjunctivitis is treated with antibiotics. Antibiotic eyedrops are most often used. However, antibiotic ointments are also available. Antibiotics pills are sometimes used. Artificial tears or eye washes may ease discomfort. HOME CARE INSTRUCTIONS   To ease discomfort, apply a cool, clean washcloth to your eye for 10-20 minutes, 3-4 times a day.  Gently wipe away any drainage from your eye with a warm, wet washcloth or a cotton ball.  Wash your hands often with soap and water. Use paper towels to dry your hands.  Do not share towels or washcloths. This may spread the infection.  Change or wash your pillowcase every day.  You should not use eye makeup until the infection is gone.  Do not operate machinery or drive if your vision is blurred.  Stop using contact lenses. Ask your caregiver how to sterilize or replace your  contacts before using them again. This depends on the type of contact lenses that you use.  When applying medicine to the infected eye, do not touch the edge of your eyelid with the eyedrop bottle or ointment tube.  SEEK IMMEDIATE MEDICAL CARE IF:   Your infection has not improved within 3 days after beginning treatment.  You had yellow discharge from your eye and it returns.  You have increased eye pain.  Your eye redness is spreading.  Your vision becomes blurred.  You have a fever or persistent symptoms for more than 2-3 days.  You have a fever and your symptoms suddenly get worse.  You have facial pain, redness, or swelling. MAKE SURE YOU:   Understand these instructions.  Will watch your condition.  Will get help right away if you are not doing well or get worse.   This information is not intended to replace advice given to you by your health care provider. Make sure you discuss any questions you have with your health care provider.   Document Released: 04/11/2005 Document Revised: 05/02/2014 Document Reviewed: 09/12/2011 Elsevier Interactive Patient Education 2016 [employer]  Eye Foreign Body A foreign body refers to any object on the surface of the eye or in the eyeball that should not be there. A foreign body may be a small speck of dirt or dust, a hair or eyelash, a splinter, or any other object.  SIGNS AND SYMPTOMS Symptoms depend on what the foreign body is and where it is in the eye. The most common locations are:   On the inner surface of the upper or lower eyelids or on the covering of the white part of the eye (conjunctiva). Symptoms in this location are:  Pain and irritation, especially when blinking.  The feeling that something is in the eye.  On the surface of the clear covering on the front of the eye (cornea). Symptoms in this location include:  Pain and irritation.   Small "rust rings" around a metallic foreign body.  The feeling that  something is in the eye.   Inside the eyeball. Foreign bodies inside the eye may cause:   Great pain.   Immediate loss of vision.   Distortion of the pupil. DIAGNOSIS  Foreign bodies are found during an exam by an eye specialist. Those on the eyelids, conjunctiva, or cornea are usually (but not always) easily found. When a foreign body is inside the eyeball, a cloudiness of the lens (cataract) may form almost right away. This makes it hard for an eye specialist  to find the foreign body. Tests may be needed, including ultrasound testing, X-rays, and CT scans. TREATMENT   Foreign bodies on the eyelids, conjunctiva, or cornea are often removed easily and painlessly.  Rust in the cornea may require the use of a drill-like instrument to remove the rust.  If the foreign body has caused a scratch or a rubbing or scraping (abrasion) of the cornea, this may be treated with antibiotic drops or ointment. A pressure patch may be put over your eye.  If the foreign body is inside your eyeball, surgery is needed right away. This is a medical emergency. Foreign bodies inside the eye threaten vision. A person may even lose his or her eye. HOME CARE INSTRUCTIONS   Take medicines only as directed by your health care provider. Use eye drops or ointment as directed.  If no eye patch was applied:  Keep your eye closed as much as possible.  Do not rub your eye.  Wear dark glasses as needed to protect your eyes from bright light.  Do not wear contact lenses until your eye feels normal again, or as instructed by your health care provider.  Wear a protective eye covering if there is a risk of eye injury. This is important when working with high-speed tools.  If your eye is patched:  Follow your health care provider's instructions for when to remove the patch.  Do notdrive or operate machinery if your eye is patched. Your ability to judge distances is impaired.  Keep all follow-up visits as  directed by your health care provider. This is important. SEEK MEDICAL CARE IF:   You have increased pain in your eye.  Your vision gets worse.   You have problems with your eye patch.   You have fluid (discharge) coming from your injured eye.   You have redness and swelling around your affected eye.  MAKE SURE YOU:   Understand these instructions.  Will watch your condition.  Will get help right away if you are not doing well or get worse.   This information is not intended to replace advice given to you by your health care provider. Make sure you discuss any questions you have with your health care provider.   Document Released: 04/11/2005 Document Revised: 05/02/2014 Document Reviewed: 09/06/2012 Elsevier Interactive Patient Education [employer]      I personally performed the services described in this documentation, which was scribed in my presence. The recorded information has been reviewed and considered, and addended by me as needed.

## 2015-05-06 MED FILL — ESCITALOPRAM 10 MG TABLET: 10 | 30 days supply | Qty: 60 | Fill #1 | Status: TO

## 2015-05-06 MED FILL — LISINOPRIL 20 MG TABLET: 20 | 30 days supply | Qty: 30 | Fill #2 | Status: TO

## 2015-05-14 ENCOUNTER — Encounter (HOSPITAL_COMMUNITY): Payer: Self-pay | Admitting: Emergency Medicine

## 2015-05-14 ENCOUNTER — Emergency Department (HOSPITAL_COMMUNITY)
Admission: EM | Admit: 2015-05-14 | Discharge: 2015-05-14 | Disposition: A | Discharge status: Home / Self Care | Payer: 59 | Attending: Emergency Medicine | Admitting: Emergency Medicine

## 2015-05-14 ENCOUNTER — Emergency Department (HOSPITAL_COMMUNITY): Payer: 59

## 2015-05-14 DIAGNOSIS — F17200 Nicotine dependence, unspecified, uncomplicated: Secondary | ICD-10-CM | POA: Diagnosis not present

## 2015-05-14 DIAGNOSIS — Z79899 Other long term (current) drug therapy: Secondary | ICD-10-CM | POA: Diagnosis not present

## 2015-05-14 DIAGNOSIS — F410 Panic disorder [episodic paroxysmal anxiety] without agoraphobia: Secondary | ICD-10-CM | POA: Diagnosis not present

## 2015-05-14 DIAGNOSIS — R112 Nausea with vomiting, unspecified: Secondary | ICD-10-CM | POA: Diagnosis not present

## 2015-05-14 DIAGNOSIS — R000 Tachycardia, unspecified: Secondary | ICD-10-CM | POA: Insufficient documentation

## 2015-05-14 DIAGNOSIS — R05 Cough: Secondary | ICD-10-CM | POA: Diagnosis present

## 2015-05-14 DIAGNOSIS — J40 Bronchitis, not specified as acute or chronic: Secondary | ICD-10-CM

## 2015-05-14 DIAGNOSIS — J45909 Unspecified asthma, uncomplicated: Secondary | ICD-10-CM | POA: Diagnosis not present

## 2015-05-14 LAB — COMPREHENSIVE METABOLIC PANEL
ALT: 27 U/L (ref 17–63)
AST: 27 U/L (ref 15–41)
Albumin: 4.1 g/dL (ref 3.5–5.0)
Alkaline Phosphatase: 54 U/L (ref 38–126)
Anion gap: 11 (ref 5–15)
BILIRUBIN TOTAL: 1 mg/dL (ref 0.3–1.2)
BUN: 13 mg/dL (ref 6–20)
CHLORIDE: 99 mmol/L — AB (ref 101–111)
CO2: 27 mmol/L (ref 22–32)
Calcium: 8.8 mg/dL — ABNORMAL LOW (ref 8.9–10.3)
Creatinine, Ser: 0.75 mg/dL (ref 0.61–1.24)
Glucose, Bld: 118 mg/dL — ABNORMAL HIGH (ref 65–99)
POTASSIUM: 3.6 mmol/L (ref 3.5–5.1)
Sodium: 137 mmol/L (ref 135–145)
TOTAL PROTEIN: 7.8 g/dL (ref 6.5–8.1)

## 2015-05-14 LAB — URINE MICROSCOPIC-ADD ON

## 2015-05-14 LAB — CBC
HCT: 45.1 % (ref 39.0–52.0)
HEMOGLOBIN: 15.4 g/dL (ref 13.0–17.0)
MCH: 30.4 pg (ref 26.0–34.0)
MCHC: 34.1 g/dL (ref 30.0–36.0)
MCV: 89.1 fL (ref 78.0–100.0)
PLATELETS: 228 10*3/uL (ref 150–400)
RBC: 5.06 MIL/uL (ref 4.22–5.81)
RDW: 12.7 % (ref 11.5–15.5)
WBC: 7.2 10*3/uL (ref 4.0–10.5)

## 2015-05-14 LAB — URINALYSIS, ROUTINE W REFLEX MICROSCOPIC
Glucose, UA: NEGATIVE mg/dL
Ketones, ur: 15 mg/dL — AB
LEUKOCYTES UA: NEGATIVE
Nitrite: NEGATIVE
PROTEIN: 30 mg/dL — AB
SPECIFIC GRAVITY, URINE: 1.037 — AB (ref 1.005–1.030)
pH: 5.5 (ref 5.0–8.0)

## 2015-05-14 LAB — RAPID STREP SCREEN (MED CTR MEBANE ONLY): STREPTOCOCCUS, GROUP A SCREEN (DIRECT): NEGATIVE

## 2015-05-14 LAB — LIPASE, BLOOD: LIPASE: 13 U/L (ref 11–51)

## 2015-05-14 MED ORDER — ONDANSETRON 4 MG PO TBDP
4.0000 mg | ORAL_TABLET | Freq: Once | ORAL | Status: AC | PRN
  Administered 2015-05-14: 4 mg via ORAL
  Filled 2015-05-14: qty 1

## 2015-05-14 MED ORDER — SODIUM CHLORIDE 0.9 % IV BOLUS (SEPSIS)
1000.0000 mL | Freq: Once | INTRAVENOUS | Status: AC
  Administered 2015-05-14: 1000 mL via INTRAVENOUS

## 2015-05-14 MED ORDER — ACETAMINOPHEN 500 MG PO TABS
1000.0000 mg | ORAL_TABLET | Freq: Once | ORAL | Status: AC
  Administered 2015-05-14: 1000 mg via ORAL
  Filled 2015-05-14: qty 2

## 2015-05-14 MED ORDER — KETOROLAC TROMETHAMINE 30 MG/ML IJ SOLN
30.0000 mg | Freq: Once | INTRAMUSCULAR | Status: DC
Start: 2015-05-14 — End: 2015-05-14
  Filled 2015-05-14: qty 1

## 2015-05-14 MED ORDER — ALBUTEROL SULFATE HFA 108 (90 BASE) MCG/ACT IN AERS
2.0000 | INHALATION_SPRAY | Freq: Four times a day (QID) | RESPIRATORY_TRACT | Status: DC
  Administered 2015-05-14: 2 via RESPIRATORY_TRACT
  Filled 2015-05-14: qty 6.7

## 2015-05-14 MED ORDER — DEXAMETHASONE SODIUM PHOSPHATE 10 MG/ML IJ SOLN
10.0000 mg | Freq: Once | INTRAMUSCULAR | Status: AC
  Administered 2015-05-14: 10 mg via INTRAVENOUS
  Filled 2015-05-14: qty 1

## 2015-05-14 MED ORDER — SODIUM CHLORIDE 0.9 % IV BOLUS (SEPSIS)
1000.0000 mL | Freq: Once | INTRAVENOUS | Status: AC
Start: 2015-05-14 — End: 2015-05-14
  Administered 2015-05-14: 1000 mL via INTRAVENOUS

## 2015-05-14 MED ORDER — KETOROLAC TROMETHAMINE 30 MG/ML IJ SOLN
30.0000 mg | Freq: Once | INTRAMUSCULAR | Status: AC
  Administered 2015-05-14: 30 mg via INTRAVENOUS
  Filled 2015-05-14: qty 1

## 2015-05-14 MED ORDER — PREDNISONE 20 MG PO TABS: 60.0000 mg | ORAL_TABLET | Freq: Every day | ORAL | Status: AC

## 2015-05-14 NOTE — ED Provider Notes (Signed)
CSN: [id number]     Arrival date & time 05/14/15  0957 History   First MD Initiated Contact with Patient 05/14/15 1159     Chief Complaint  Patient presents with  . Cough  . Emesis  . Fever     HPI  Patient presents with concern of cough, congestion, nausea, vomiting, weakness. He also has myalgia. Symptoms began about 3 days ago. Since onset symptoms have been progressive, with no relief in spite of using OTC medication. No objective fever, the patient feels flushed. Patient was well prior to the onset of symptoms. However, the patient is currently undergoing detoxification for substance abuse. He denies confusion, disorientation, rash, nuchal rigidity   Past Medical History  Diagnosis Date  . Asthma     childhood  . Anxiety     anxiety and panic disorder   History reviewed. No pertinent past surgical history. Family History  Problem Relation Age of Onset  . Alcohol abuse Father   . Drug abuse Father     cocaine  . Heart disease Father     MI  . COPD Other   . Other Other     lung tumor   Social History  Substance Use Topics  . Smoking status: Current Every Day Smoker -- 0.00 packs/day  . Smokeless tobacco: None  . Alcohol Use: Yes     Comment: social    Review of Systems  Constitutional:       Per HPI, otherwise negative  HENT:       Per HPI, otherwise negative  Respiratory:       Per HPI, otherwise negative  Cardiovascular:       Per HPI, otherwise negative  Gastrointestinal: Positive for nausea and vomiting.  Endocrine:       Negative aside from HPI  Genitourinary:       Neg aside from HPI   Musculoskeletal:       Per HPI, otherwise negative  Skin: Negative.   Neurological: Negative for syncope.      Allergies  Review of patient's allergies indicates no known allergies.  Home Medications   Prior to Admission medications   Medication Sig Start Date End Date Taking? Authorizing Provider  ALPRAZolam (XANAX) 0.25 MG tablet Take 0.25 mg by  mouth at bedtime as needed. Sleep/anxiety 05/08/15  Yes Historical Provider, MD  escitalopram (LEXAPRO) 10 MG tablet Take 10 mg by mouth daily.   Yes Historical Provider, MD  fish oil-omega-3 fatty acids 1000 MG capsule Take 2 g by mouth 2 (two) times daily.     Yes Historical Provider, MD  lisinopril (PRINIVIL,ZESTRIL) 20 MG tablet Take 20 mg by mouth daily.   Yes Historical Provider, MD  Multiple Vitamins-Minerals (MULTIVITAMINS THER. W/MINERALS) TABS Take 1 tablet by mouth daily.     Yes Historical Provider, MD  VIAGRA 50 MG tablet Take 50 mg by mouth daily as needed. For ED. 03/02/15  Yes Historical Provider, MD  ZUBSOLV 5.7-1.4 MG SUBL Place 1 tablet under the tongue every 12 (twelve) hours. 05/12/15  Yes Historical Provider, MD  ciprofloxacin (CILOXAN) 0.3 % ophthalmic solution Place 1 drop into the right eye every 2 (two) hours. Administer 1 drop, every 2 hours, while awake, for 2 days. Then 1 drop, every 4 hours, while awake, for the next 5 days. Patient not taking: Reported on 05/14/2015 04/01/15   Shade Flood, MD  ipratropium (ATROVENT) 0.03 % nasal spray Place 2 sprays into the nose 2 (two) times daily. Patient not  taking: Reported on 05/14/2015 10/24/12   Heather M Marte, PA-C   BP 142/84 mmHg  Pulse 120  Temp(Src) 99.8 F (37.7 C) (Oral)  Resp 17  SpO2 93% Physical Exam  Constitutional: He is oriented to person, place, and time. He appears well-developed. No distress.  HENT:  Head: Normocephalic and atraumatic.  Eyes: Conjunctivae and EOM are normal.  Cardiovascular: Regular rhythm.  Tachycardia present.   Pulmonary/Chest: No stridor. He has decreased breath sounds.  Abdominal: He exhibits no distension.  Musculoskeletal: He exhibits no edema.  Neurological: He is alert and oriented to person, place, and time.  Skin: Skin is warm and dry.  Psychiatric: He has a normal mood and affect.  Nursing note and vitals reviewed.   ED Course  Procedures (including critical care  time) Labs Review Labs Reviewed  COMPREHENSIVE METABOLIC PANEL - Abnormal; Notable for the following:    Chloride 99 (*)    Glucose, Bld 118 (*)    Calcium 8.8 (*)    All other components within normal limits  URINALYSIS, ROUTINE W REFLEX MICROSCOPIC (NOT AT [facility]) - Abnormal; Notable for the following:    Color, Urine AMBER (*)    APPearance CLOUDY (*)    Specific Gravity, Urine 1.037 (*)    Hgb urine dipstick SMALL (*)    Bilirubin Urine SMALL (*)    Ketones, ur 15 (*)    Protein, ur 30 (*)    All other components within normal limits  URINE MICROSCOPIC-ADD ON - Abnormal; Notable for the following:    Squamous Epithelial / LPF 0-5 (*)    Bacteria, UA FEW (*)    All other components within normal limits  RAPID STREP SCREEN (NOT AT [facility])  LIPASE, BLOOD  CBC    Imaging Review Dg Chest 2 View  05/14/2015  CLINICAL DATA:  35 year old male with cough and fever for 3 days. EXAM: CHEST  2 VIEW COMPARISON:  08/11/2014 FINDINGS: The cardiomediastinal silhouette is unremarkable. Lingular subsegmental atelectasis is now identified. Mild peribronchial thickening is unchanged. There is no evidence of focal airspace disease, pulmonary edema, suspicious pulmonary nodule/mass, pleural effusion, or pneumothorax. No acute bony abnormalities are identified. IMPRESSION: New subsegmental lingular atelectasis with mild chronic peribronchial thickening. No evidence of focal pneumonia. Electronically Signed   By: Harmon Pier M.D.   On: 05/14/2015 13:22   I have personally reviewed and evaluated these images and lab results as part of my medical decision-making.  With initial tachycardia, report of hypotension, patient received IV fluids.    3:13 PM Patient improving, heart rate below 100, or trouble breathing diminished. We reviewed all findings per With concern for bronchitis, possible viral infection, patient will be started on steroids, albuterol.   MDM  Young male presents with several days of  fever, chills, nausea, vomiting, cough. Patient has clinical signs concerning for bronchitis, possible viral infection. No evidence for strep throat. Patient improved substantially, from initial signs consistent with dehydration. With initiation of home medication, patient with a peripheral discharge with outpatient follow-up, therapy.  Gerhard Munch, MD 05/14/15 [phone]

## 2015-05-14 NOTE — Discharge Instructions (Signed)
As discussed, today's evaluation has resulted in a diagnosis of bronchitis.  It is important to stay well-hydrated, take all medication as directed, and be sure to follow-up with your primary care physician.  For the next 24 hours, please use the provided albuterol inhaler every 4 hours.  Subsequently, you may use it as needed.

## 2015-05-14 NOTE — ED Notes (Signed)
Fever, nausea, vomiting, cough x 3 days. Wife states projectile vomiting yesterday morning without warning. PCP referred him here d/t positive orthostatic vitals. Lung sounds clear, pt has a wet productive cough with yellow sputum. Took cold medication around 5 am before coming today. Mask on in triage.

## 2015-05-17 LAB — CULTURE, GROUP A STREP (THRC)

## 2017-03-15 ENCOUNTER — Ambulatory Visit (HOSPITAL_COMMUNITY)
Admission: RE | Admit: 2017-03-15 | Discharge: 2017-03-15 | Disposition: A | Discharge status: Home / Self Care | Payer: Managed Care, Other (non HMO) | Attending: Psychiatry | Admitting: Psychiatry

## 2017-03-15 DIAGNOSIS — F1910 Other psychoactive substance abuse, uncomplicated: Secondary | ICD-10-CM

## 2017-03-15 DIAGNOSIS — F419 Anxiety disorder, unspecified: Secondary | ICD-10-CM | POA: Insufficient documentation

## 2017-03-15 DIAGNOSIS — F1120 Opioid dependence, uncomplicated: Secondary | ICD-10-CM | POA: Insufficient documentation

## 2017-03-15 DIAGNOSIS — F332 Major depressive disorder, recurrent severe without psychotic features: Secondary | ICD-10-CM

## 2017-03-15 NOTE — H&P (Signed)
Behavioral Health Medical Screening Exam  Jordan Pugh is an 36 y.o. male.seeking detox and treatment for a 10 yr hx Opiate addiction culminating in current job,financial and relationship difficulties.He took his last 100 mg of Oxycontin today and is ion no distress/acute withdrawal at this time. Pt obtained over a year of abstienece with [facility] from mid 2016 to late 2017.Pt reports he got a new job and stopped going to his 4 meetings per week;speaking with his sponsor and ended up isolated alone and using again.He wants to STAY STOPPED and is seeking detox followed by Inpt treatment. 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 Constitutional: He is oriented to person, place, and time. He appears well-developed and well-nourished. No distress.  HENT:  Head: Normocephalic and atraumatic.  Right Ear: External ear normal.  Left Ear: External ear normal.  Mouth/Throat: Oropharynx is clear and moist.  Eyes: Conjunctivae and EOM are normal. Pupils are equal, round, and reactive to light. Right eye exhibits no discharge. Left eye exhibits no discharge. No scleral icterus.  Pupils constricted  Neck: Normal range of motion. No JVD present. No tracheal deviation present. No thyromegaly present.  Cardiovascular: Normal rate, regular rhythm and intact distal pulses.  No murmur heard. Respiratory: Effort normal and breath sounds normal. No stridor. No respiratory distress.  GI: Soft. There is no tenderness.  Genitourinary:  Genitourinary Comments: Deferred  Musculoskeletal: Normal range of motion. He  exhibits no edema, tenderness or deformity.  Lymphadenopathy:    He has no cervical adenopathy.  Neurological: He is alert and oriented to person, place, and time. He has normal reflexes. No cranial nerve deficit. He exhibits normal muscle tone. Coordination normal.  Skin: Skin is warm and dry. He is not diaphoretic.  No piloercetion No needle marks  Psychiatric:  See PSE    Review of Systems  Constitutional: Negative for chills, diaphoresis, fever, malaise/fatigue and weight loss.  HENT: Negative for congestion, ear discharge, ear pain, hearing loss, nosebleeds, sinus pain, sore throat and tinnitus.   Eyes: Negative for blurred vision, double vision, photophobia, pain, discharge and redness.  Respiratory: Negative for cough, hemoptysis, sputum production, shortness of breath, wheezing and stridor.   Cardiovascular: Negative for chest pain, palpitations, orthopnea, claudication, leg swelling and PND.       Hx of Hypercholesteremia  Gastrointestinal: Negative for abdominal pain, blood in stool, constipation, diarrhea, heartburn, melena, nausea and vomiting.  Genitourinary: Negative for dysuria, flank pain, frequency, hematuria and urgency.  Musculoskeletal: Negative for back pain, falls, joint pain, myalgias and neck pain.  Skin: Negative for itching and rash.  Neurological: Negative for dizziness, tingling, tremors, sensory change, speech change, focal weakness, seizures, loss of consciousness, weakness and headaches.  Endo/Heme/Allergies: Negative for environmental allergies and polydipsia. Does not bruise/bleed easily.  Psychiatric/Behavioral: Positive for depression and substance abuse (Opiate Addiction x 10 yrs.Over 1 yr abstinent with OP [facility] and NA 2016-7  ). Negative for hallucinations, memory loss and suicidal ideas. The patient is not nervous/anxious (Took 100mg  Oxycodne today no w/d yet) and does not have insomnia.     BP140/90 P-80 R-16 Hgt5'11" Wgt 207 lbs.Body mass index is  28.87 kg/m.  General Appearance: Casual and Fairly Groomed  Eye Contact:  Good  Speech:  Clear and Coherent  Volume:  Normal  Mood:  Euthymic  Affect:  Congruent  Thought Process:  Coherent, Goal Directed and Descriptions of Associations: Intact  Orientation:  Full (Time, Place, and Person)  Thought Content:  WDL and Logical  Suicidal Thoughts:  No  Homicidal Thoughts:  No  Memory:  Negative  Judgement:  Intact  Insight:  Good  Psychomotor Activity:  Decreased  Concentration: Concentration: Good and Attention Span: Good  Recall:  Good  Fund of Knowledge:Good  Language: Good  Akathisia:  NA  Handed:  Right  AIMS (if indicated):     Assets:  Communication Skills Desire for Improvement Financial Resources/Insurance Housing Physical Health Resilience Social Support Talents/Skills Transportation  Sleep:   No complaint    Musculoskeletal: Strength & Muscle Tone: within normal limits Gait & Station: normal Patient leans: N/A    Recommendations:  Based on my evaluation the patient does not appear to have an emergency medical condition.  [facility] was contacted and will take pt for detox and [facility]  Maryjean Mornharles Lateefah Mallery, PA-C 03/15/2017, 9:34 PM

## 2017-03-15 NOTE — BH Assessment (Signed)
Assessment Note  Jordan Pugh N Pugh is an 36 y.o. male [facility] reporting substance abuse and depression.  Pt. Reported hx of drug abuse to include; Opioids, Cannabis, and Heroin use. Pt completed IOP with [facility] and stayed clean for over a year and relapsed three weeks ago with Opioid use.  Pt reported he did 100 mg of oxycodone today.  Pt. stated "he couldn't kick this opioid thing". Pt. Reported the drug abuse was effecting his relationships and finances.Current stressors for relapse are that he "hates his job, relationship issues, and boredom".   Pt. Reports he "wakes up sad". Pt has a hx of anxiety and depression.  Pt reports feelings of worthlessness. Pt reports alcohol no use but denies an issue with alcohol reporting he only drinks a few beers once a month. Pt reports cannabis use which is on-going.   Pt. adamantly denies and hx or current issues of SI, HI and AH/VH.  Maryjean Mornharles Kober PA recommends that he follow up with [facility] for detox services and inpatient treatment.   Diagnosis: Opioid use disorder, Severe   Past Medical History:  Past Medical History:  Diagnosis Date  . Anxiety    anxiety and panic disorder  . Asthma    childhood    No past surgical history on file.  Family History:  Family History  Problem Relation Age of Onset  . Alcohol abuse Father   . Drug abuse Father        cocaine  . Heart disease Father        MI  . COPD Other   . Other Other        lung tumor    Social History:  reports that he has been smoking.  He has been smoking about 0.00 packs per day. He does not have any smokeless tobacco history on file. He reports that he drinks alcohol. He reports that he does not use drugs.  Additional Social History:  Alcohol / Drug Use Pain Medications: See MAR Prescriptions: See MAR Over the Counter: See MAR History of alcohol / drug use?: Yes Longest period of sobriety (when/how long): 1 year Negative  Consequences of Use: Financial, Personal relationships Substance #1 Name of Substance 1: Oxy 1 - Age of First Use: Ukn 1 - Amount (size/oz): 100 mg 1 - Frequency: Daily 1 - Duration: Ongoing 1 - Last Use / Amount: 100mg  Substance #2 Name of Substance 2: Cannabis 2 - Age of First Use: Ukn 2 - Amount (size/oz): Ukn 2 - Frequency: Ukn 2 - Duration: Ongoing 2 - Last Use / Amount: Ukn Substance #3 Name of Substance 3: Heroin 3 - Age of First Use: Ukn 3 - Amount (size/oz): Ukn 3 - Frequency: Ukn 3 - Duration: Ukn 3 - Last Use / Amount: Ukn Substance #4 Name of Substance 4: Alcohol 4 - Age of First Use: Ukn 4 - Amount (size/oz): 2 or 3 beers 4 - Frequency: Monthly 4 - Duration: Ongoing 4 - Last Use / Amount: 3 weeks ago  CIWA: CIWA-Ar BP: 140/90 Pulse Rate: 80 COWS:    Allergies: No Known Allergies  Home Medications:  (Not in a hospital admission)  OB/GYN Status:  No LMP for male patient.  General Assessment Data [facility] TTS Assessment: In system Is this a Tele or Face-to-Face Assessment?: Face-to-Face Is this an Initial Assessment or a Re-assessment for this encounter?: Initial Assessment Marital status: Single Is patient pregnant?: No Pregnancy  Status: No Living Arrangements: Spouse/significant other Can pt return to current living arrangement?: Yes Admission Status: Voluntary Is patient capable of signing voluntary admission?: Yes Referral Source: Self/Family/Friend Insurance type: [facility])  Medical Screening Exam [facility] Walk-in ONLY) Medical Exam completed: Yes  Crisis Care Plan Living Arrangements: Spouse/significant other Name of Psychiatrist: None Name of Therapist: None  Education Status Is patient currently in school?: No Highest grade of school patient has completed: Ukn  Risk to self with the past 6 months Suicidal Ideation: No Has patient been a risk to self within the past 6 months prior to admission? :  Yes Suicidal Intent: No Has patient had any suicidal intent within the past 6 months prior to admission? : No Is patient at risk for suicide?: No Suicidal Plan?: No Has patient had any suicidal plan within the past 6 months prior to admission? : No Access to Means: No What has been your use of drugs/alcohol within the last 12 months?: (Oxy, Cannabis, Heroin) Previous Attempts/Gestures: No How many times?: 0 Triggers for Past Attempts: None known Intentional Self Injurious Behavior: None Family Suicide History: No Recent stressful life event(s): Conflict (Comment), Financial Problems, Other (Comment)(Pt reports financial stress, relational stress and job stres) Persecutory voices/beliefs?: No Depression: Yes Depression Symptoms: Feeling worthless/self pity Substance abuse history and/or treatment for substance abuse?: (Pt completed [facility]) Suicide prevention information given to non-admitted patients: Yes  Risk to Others within the past 6 months Homicidal Ideation: No Does patient have any lifetime risk of violence toward others beyond the six months prior to admission? : No Thoughts of Harm to Others: No Current Homicidal Intent: No Current Homicidal Plan: No Access to Homicidal Means: No History of harm to others?: No Assessment of Violence: None Noted Does patient have access to weapons?: No Criminal Charges Pending?: No Does patient have a court date: No Is patient on probation?: No  Psychosis Hallucinations: None noted Delusions: None noted  Mental Status Report Appearance/Hygiene: Unremarkable Eye Contact: Good Motor Activity: Unremarkable Speech: Unremarkable Level of Consciousness: Alert Mood: Depressed Affect: Sad, Depressed, Anxious Anxiety Level: Minimal Thought Processes: Coherent Judgement: Impaired Orientation: Person, Place, Time, Situation Obsessive Compulsive Thoughts/Behaviors: None  Cognitive Functioning Concentration:  Normal Memory: Recent Intact IQ: Average Insight: Poor Impulse Control: Poor Appetite: Good Sleep: No Change  ADLScreening [facility] Assessment Services) Patient's cognitive ability adequate to safely complete daily activities?: Yes Patient able to express need for assistance with ADLs?: Yes Independently performs ADLs?: Yes (appropriate for developmental age)  Prior Inpatient Therapy Prior Inpatient Therapy: No  Prior Outpatient Therapy Prior Outpatient Therapy: Yes(IOP [facility]) Prior Therapy Dates: 2017 Prior Therapy Facilty/Provider(s): [facility] Reason for Treatment: Substance Abuse Does patient have an ACCT team?: No Does patient have Intensive In-House Services?  : No Does patient have [facility] services? : No Does patient have P4CC services?: No  ADL Screening (condition at time of admission) Patient's cognitive ability adequate to safely complete daily activities?: Yes Is the patient deaf or have difficulty hearing?: No Does the patient have difficulty seeing, even when wearing glasses/contacts?: No Does the patient have difficulty concentrating, remembering, or making decisions?: No Patient able to express need for assistance with ADLs?: Yes Does the patient have difficulty dressing or bathing?: No Independently performs ADLs?: Yes (appropriate for developmental age) Does the patient have difficulty walking or climbing stairs?: No Weakness of Legs: None Weakness of Arms/Hands: None  Home Assistive Devices/Equipment Home Assistive Devices/Equipment: None    Abuse/Neglect Assessment (Assessment to be  complete while patient is alone) Abuse/Neglect Assessment Can Be Completed: Yes Physical Abuse: Denies Verbal Abuse: Denies Sexual Abuse: Denies Exploitation of patient/patient's resources: Denies Self-Neglect: Denies Values / Beliefs Cultural Requests During Hospitalization: None Spiritual Requests During Hospitalization: None Consults Spiritual Care  Consult Needed: No Social Work Consult Needed: No Merchant navy officerAdvance Directives (For Healthcare) Does Patient Have a Medical Advance Directive?: No Would patient like information on creating a medical advance directive?: No - Patient declined    Additional Information 1:1 In Past 12 Months?: No CIRT Risk: No Elopement Risk: No Does patient have medical clearance?: Yes     Disposition:  Disposition Initial Assessment Completed for this Encounter: Yes Disposition of Patient: Discharge with Outpatient Resources(Per Maryjean Mornharles Kober PA)  On Site Evaluation by:   Reviewed with Physician:    Danae OrleansVanessa  Nori Poland 03/15/2017 9:54 PM

## 2017-05-31 ENCOUNTER — Emergency Department (HOSPITAL_COMMUNITY)
Admission: EM | Admit: 2017-05-31 | Discharge: 2017-06-01 | Disposition: A | Discharge status: Home / Self Care | Payer: Self-pay | Attending: Emergency Medicine | Admitting: Emergency Medicine

## 2017-05-31 DIAGNOSIS — F419 Anxiety disorder, unspecified: Secondary | ICD-10-CM | POA: Insufficient documentation

## 2017-05-31 DIAGNOSIS — F17200 Nicotine dependence, unspecified, uncomplicated: Secondary | ICD-10-CM | POA: Insufficient documentation

## 2017-05-31 DIAGNOSIS — R0602 Shortness of breath: Secondary | ICD-10-CM

## 2017-05-31 DIAGNOSIS — R197 Diarrhea, unspecified: Secondary | ICD-10-CM | POA: Insufficient documentation

## 2017-05-31 DIAGNOSIS — Z79899 Other long term (current) drug therapy: Secondary | ICD-10-CM | POA: Insufficient documentation

## 2017-05-31 DIAGNOSIS — R140 Abdominal distension (gaseous): Secondary | ICD-10-CM | POA: Insufficient documentation

## 2017-05-31 DIAGNOSIS — J45909 Unspecified asthma, uncomplicated: Secondary | ICD-10-CM | POA: Insufficient documentation

## 2017-05-31 DIAGNOSIS — R1110 Vomiting, unspecified: Secondary | ICD-10-CM | POA: Insufficient documentation

## 2017-05-31 MED ORDER — LORAZEPAM 1 MG PO TABS
1.0000 mg | ORAL_TABLET | Freq: Once | ORAL | Status: DC
  Filled 2017-05-31: qty 1

## 2017-05-31 MED ORDER — SODIUM CHLORIDE 0.9 % IV BOLUS (SEPSIS)
1000.0000 mL | Freq: Once | INTRAVENOUS | Status: AC
  Administered 2017-06-01: 1000 mL via INTRAVENOUS

## 2017-05-31 NOTE — ED Triage Notes (Signed)
Pt reports history of feeling like he can't get his breath, states this started approx 1730 tonight, has had intermittent abd pains, vomited x 1 today, not eating or drinking much, not sleeping x 3 days.  Pt states he does have panic attacks but does not feel like this is the same.

## 2017-05-31 NOTE — ED Provider Notes (Signed)
[facility] EMERGENCY DEPARTMENT Provider Note   CSN: [id number] Arrival date & time: 05/31/17  2256     History   Chief Complaint Chief Complaint  Patient presents with  . Shortness of Breath    HPI Jordan Pugh is a 37 y.o. male.  Patient presents to the emergency department for evaluation of shortness of breath.  Patient reports that he started having shortness of breath while driving at 4:69 PM today.  Patient reports that symptoms worsen with certain positions, such as sitting up or moving.  He has not having chest pain.  He does report that today he has been feeling abdominal distention and bloating, has had several episodes of diarrhea.  He did have one episode of emesis earlier today.  He has not had much to eat or drink today, feels dehydrated.  He also has not been sleeping well the last few nights.      Past Medical History:  Diagnosis Date  . Anxiety    anxiety and panic disorder  . Asthma    childhood    Patient Active Problem List   Diagnosis Date Noted  . Sinusitis 08/16/2010  . INSOMNIA, CHRONIC 03/23/2010  . 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 DYSLIPIDEMIA 01/22/2010  . ARM PAIN, RIGHT 06/17/2009  . TOBACCO ABUSE 12/24/2007  . ANXIETY DISORDER 12/04/2007  . CHEST PAIN 12/04/2007    No past surgical history on file.     Home Medications    Prior to Admission medications   Medication Sig Start Date End Date Taking? Authorizing Provider  ALPRAZolam (XANAX) 0.25 MG tablet Take 0.25 mg by mouth at bedtime as needed. Sleep/anxiety 05/08/15   [provider]  ciprofloxacin (CILOXAN) 0.3 % ophthalmic solution Place 1 drop into the right eye every 2 (two) hours. Administer 1 drop, every 2 hours, while awake, for 2 days. Then 1 drop, every 4 hours, while awake, for the next 5 days. Patient not taking: Reported on 05/14/2015 04/01/15   Shade Flood, MD  diphenoxylate-atropine (LOMOTIL) 2.5-0.025 MG tablet Take 2 tablets by mouth 4 (four) times  daily as needed for diarrhea or loose stools. 06/01/17   Gilda Crease, MD  escitalopram (LEXAPRO) 10 MG tablet Take 10 mg by mouth daily.    [provider]  fish oil-omega-3 fatty acids 1000 MG capsule Take 2 g by mouth 2 (two) times daily.      [provider]  ipratropium (ATROVENT) 0.03 % nasal spray Place 2 sprays into the nose 2 (two) times daily. Patient not taking: Reported on 05/14/2015 10/24/12   Nelva Nay, PA-C  lisinopril (PRINIVIL,ZESTRIL) 20 MG tablet Take 20 mg by mouth daily.    [provider]  Multiple Vitamins-Minerals (MULTIVITAMINS THER. W/MINERALS) TABS Take 1 tablet by mouth daily.      [provider]  ondansetron (ZOFRAN) 4 MG tablet Take 1 tablet (4 mg total) by mouth every 6 (six) hours. 06/01/17   Gilda Crease, MD  VIAGRA 50 MG tablet Take 50 mg by mouth daily as needed. For ED. 03/02/15   [provider]  ZUBSOLV 5.7-1.4 MG SUBL Place 1 tablet under the tongue every 12 (twelve) hours. 05/12/15   [provider]    Family History Family History  Problem Relation Age of Onset  . Alcohol abuse Father   . Drug abuse Father        cocaine  . Heart disease Father        MI  .  COPD Other   . Other Other        lung tumor    Social History Social History   Tobacco Use  . Smoking status: Current Every Day Smoker    Packs/day: 0.00  Substance Use Topics  . Alcohol use: Yes    Comment: social  . Drug use: No     Allergies   Patient has no known allergies.   Review of Systems Review of Systems  Respiratory: Positive for shortness of breath.   Gastrointestinal: Positive for abdominal pain, diarrhea and nausea.  All other systems reviewed and are negative.    Physical Exam Updated Vital Signs BP 126/86   Pulse 86   Resp 12   Ht 5\' 11"  (1.803 m)   Wt 97.5 kg (215 lb)   SpO2 100%   BMI 29.99 kg/m   Physical Exam  Constitutional: He is oriented to person, place, and time.  He appears well-developed and well-nourished. No distress.  HENT:  Head: Normocephalic and atraumatic.  Right Ear: Hearing normal.  Left Ear: Hearing normal.  Nose: Nose normal.  Mouth/Throat: Oropharynx is clear and moist and mucous membranes are normal.  Eyes: Conjunctivae and EOM are normal. Pupils are equal, round, and reactive to light.  Neck: Normal range of motion. Neck supple.  Cardiovascular: Regular rhythm, S1 normal and S2 normal. Exam reveals no gallop and no friction rub.  No murmur heard. Pulmonary/Chest: Effort normal and breath sounds normal. No respiratory distress. He exhibits no tenderness.  Abdominal: Soft. Normal appearance and bowel sounds are normal. There is no hepatosplenomegaly. There is tenderness in the epigastric area and left upper quadrant. There is no rebound, no guarding, no tenderness at McBurney's point and negative Murphy's sign. No hernia.  Musculoskeletal: Normal range of motion.  Neurological: He is alert and oriented to person, place, and time. He has normal strength. No cranial nerve deficit or sensory deficit. Coordination normal. GCS eye subscore is 4. GCS verbal subscore is 5. GCS motor subscore is 6.  Skin: Skin is warm, dry and intact. No rash noted. No cyanosis.  Psychiatric: He has a normal mood and affect. His speech is normal and behavior is normal. Thought content normal.  Nursing note and vitals reviewed.    ED Treatments / Results  Labs (all labs ordered are listed, but only abnormal results are displayed) Labs Reviewed  COMPREHENSIVE METABOLIC PANEL - Abnormal; Notable for the following components:      Result Value   Potassium 3.1 (*)    CO2 20 (*)    Glucose, Bld 105 (*)    ALT 15 (*)    Total Bilirubin 2.1 (*)    All other components within normal limits  CBC WITH DIFFERENTIAL/PLATELET  LIPASE, BLOOD  INFLUENZA PANEL BY PCR (TYPE A & B)  D-DIMER, QUANTITATIVE (NOT AT [facility])  I-STAT TROPONIN, ED    EKG  EKG  Interpretation  Date/Time:  Wednesday May 31 2017 23:06:24 EST Ventricular Rate:  96 PR Interval:    QRS Duration: 116 QT Interval:  358 QTC Calculation: 453 R Axis:   13 Text Interpretation:  Sinus rhythm Incomplete right bundle branch block No previous tracing Confirmed by Gilda CreasePollina, Calla Wedekind J [phone]([id number]) on 06/01/2017 12:22:52 AM       Radiology Dg Chest 2 View  Result Date: 06/01/2017 CLINICAL DATA:  Lost 8 pounds over the past 4 days. Acute onset of shortness of breath and cough. EXAM: CHEST  2 VIEW COMPARISON:  Chest radiograph performed 05/14/2015  FINDINGS: The lungs are well-aerated and clear. There is no evidence of focal opacification, pleural effusion or pneumothorax. The heart is normal in size; the mediastinal contour is within normal limits. No acute osseous abnormalities are seen. IMPRESSION: No acute cardiopulmonary process seen. Electronically Signed   By: Roanna Raider M.D.   On: 06/01/2017 02:26   Ct Abdomen Pelvis W Contrast  Result Date: 06/01/2017 CLINICAL DATA:  Initial evaluation for intermittent abdominal pain, vomiting. EXAM: CT ABDOMEN AND PELVIS WITH CONTRAST TECHNIQUE: Multidetector CT imaging of the abdomen and pelvis was performed using the standard protocol following bolus administration of intravenous contrast. CONTRAST:  ISOVUE-300 IOPAMIDOL (ISOVUE-300) INJECTION 61% COMPARISON:  None. FINDINGS: Lower chest: Visualized lung bases are clear. Hepatobiliary: Liver demonstrates a normal contrast enhanced appearance. Focal fat deposition noted adjacent to the falciform ligament. Gallbladder within normal limits. No biliary dilatation. Pancreas: Pancreas within normal limits. Spleen: Spleen within normal limits. Adrenals/Urinary Tract: Adrenal glands are normal. Kidneys equal in size with symmetric enhancement. Punctate nonobstructive right renal calculi measuring up to 3 mm noted. No left-sided calculi. No hydronephrosis. No focal enhancing renal mass. No  hydroureter. Partially distended bladder within normal limits. Mild circumferential bladder wall thickening most likely related incomplete distension. Stomach/Bowel: Small hiatal hernia noted. Stomach otherwise unremarkable. No evidence for bowel obstruction. Appendix is normal. Colon is largely decompressed. No acute inflammatory changes seen about the bowels. Vascular/Lymphatic: Normal intravascular enhancement seen throughout the intra-abdominal aorta and its branch vessels. No pathologically enlarged intra-abdominal or pelvic lymph nodes. Reproductive: Prostate within normal limits. Other: No free air or fluid. Musculoskeletal: No acute osseous abnormality. No worrisome lytic or blastic osseous lesions. IMPRESSION: 1. No CT evidence for acute intra-abdominopelvic process. 2. Punctate nonobstructive right renal nephrolithiasis. Electronically Signed   By: Rise Mu M.D.   On: 06/01/2017 02:36    Procedures Procedures (including critical care time)  Medications Ordered in ED Medications  sodium chloride 0.9 % bolus 1,000 mL (0 mLs Intravenous Stopped 06/01/17 0222)  ALPRAZolam (XANAX) tablet 1 mg (1 mg Oral Given 06/01/17 0046)  sodium chloride 0.9 % bolus 1,000 mL (1,000 mLs Intravenous New Bag/Given 06/01/17 0145)  iopamidol (ISOVUE-300) 61 % injection 100 mL (100 mLs Intravenous Contrast Given 06/01/17 0214)     Initial Impression / Assessment and Plan / ED Course  I have reviewed the triage vital signs and the nursing notes.  Pertinent labs & imaging results that were available during my care of the patient were reviewed by me and considered in my medical decision making (see chart for details).     Patient presents with multiple complaints.  Patient reports that he had onset of shortness of breath earlier tonight.  He does have a history of anxiety but this is different than what he normally gets with his panic attacks.  In addition to this he has been experiencing abdominal  distention, early satiety, fullness for feeling.  There is been pain and across his upper abdomen.  He did vomit one time but has been eating, drinking or sleeping for the last several days.  I suspect his current presentation is multifactorial.  At least some of his symptoms are secondary to anxiety.  He does not have any PE risk factors.  He was borderline tachycardic on arrival, but is not tachypneic or hypoxic.  His d-dimer is normal.  Likewise EKG is nonspecific with a normal troponin.  No concern for acute coronary syndrome.  Patient is now focusing more on his abdominal discomfort.  He has had multiple episodes of diarrhea here in the ER.  Patient therefore underwent CT scan of abdomen and pelvis.  No acute pathology noted.  His chest x-ray does not show any evidence of pneumonia or other pathology.  Likely a viral enteritis with some anxiety.  Extensive workup is entirely unremarkable, patient reassured, will discharge, continue with Xanax for anxiety and treat diarrhea.  Final Clinical Impressions(s) / ED Diagnoses   Final diagnoses:  Diarrhea, unspecified type  Shortness of breath  Anxiety    ED Discharge Orders        Ordered    ondansetron (ZOFRAN) 4 MG tablet  Every 6 hours     06/01/17 0249    diphenoxylate-atropine (LOMOTIL) 2.5-0.025 MG tablet  4 times daily PRN     06/01/17 0249       Gilda Crease, MD 06/01/17 [phone]

## 2017-06-01 ENCOUNTER — Emergency Department (HOSPITAL_COMMUNITY): Payer: Self-pay

## 2017-06-01 LAB — INFLUENZA PANEL BY PCR (TYPE A & B)
Influenza A By PCR: NEGATIVE
Influenza B By PCR: NEGATIVE

## 2017-06-01 LAB — CBC WITH DIFFERENTIAL/PLATELET
BASOS PCT: 1 %
Basophils Absolute: 0.1 10*3/uL (ref 0.0–0.1)
Eosinophils Absolute: 0 10*3/uL (ref 0.0–0.7)
Eosinophils Relative: 0 %
HEMATOCRIT: 45.5 % (ref 39.0–52.0)
HEMOGLOBIN: 16.2 g/dL (ref 13.0–17.0)
LYMPHS PCT: 36 %
Lymphs Abs: 3.7 10*3/uL (ref 0.7–4.0)
MCH: 29.8 pg (ref 26.0–34.0)
MCHC: 35.6 g/dL (ref 30.0–36.0)
MCV: 83.6 fL (ref 78.0–100.0)
MONO ABS: 0.7 10*3/uL (ref 0.1–1.0)
Monocytes Relative: 7 %
NEUTROS ABS: 5.6 10*3/uL (ref 1.7–7.7)
NEUTROS PCT: 56 %
Platelets: 310 10*3/uL (ref 150–400)
RBC: 5.44 MIL/uL (ref 4.22–5.81)
RDW: 12.6 % (ref 11.5–15.5)
WBC: 10.1 10*3/uL (ref 4.0–10.5)

## 2017-06-01 LAB — COMPREHENSIVE METABOLIC PANEL
ALBUMIN: 4.7 g/dL (ref 3.5–5.0)
ALK PHOS: 59 U/L (ref 38–126)
ALT: 15 U/L — AB (ref 17–63)
AST: 19 U/L (ref 15–41)
Anion gap: 14 (ref 5–15)
BUN: 13 mg/dL (ref 6–20)
CO2: 20 mmol/L — ABNORMAL LOW (ref 22–32)
CREATININE: 1.05 mg/dL (ref 0.61–1.24)
Calcium: 9.8 mg/dL (ref 8.9–10.3)
Chloride: 105 mmol/L (ref 101–111)
GFR calc Af Amer: >60 mL/min (ref >60)
GLUCOSE: 105 mg/dL — AB (ref 65–99)
POTASSIUM: 3.1 mmol/L — AB (ref 3.5–5.1)
Sodium: 139 mmol/L (ref 135–145)
Total Bilirubin: 2.1 mg/dL — ABNORMAL HIGH (ref 0.3–1.2)
Total Protein: 8.1 g/dL (ref 6.5–8.1)

## 2017-06-01 LAB — I-STAT TROPONIN, ED: Troponin i, poc: 0 ng/mL (ref 0.00–0.08)

## 2017-06-01 LAB — D-DIMER, QUANTITATIVE (NOT AT ARMC): D DIMER QUANT: 0.33 ug{FEU}/mL (ref 0.00–0.50)

## 2017-06-01 LAB — LIPASE, BLOOD: Lipase: 22 U/L (ref 11–51)

## 2017-06-01 MED ORDER — ONDANSETRON HCL 4 MG PO TABS: 4.0000 mg | ORAL_TABLET | Freq: Four times a day (QID) | ORAL | 0 refills | Status: AC

## 2017-06-01 MED ORDER — IOPAMIDOL (ISOVUE-300) INJECTION 61%
100.0000 mL | Freq: Once | INTRAVENOUS | Status: AC | PRN
  Administered 2017-06-01: 100 mL via INTRAVENOUS

## 2017-06-01 MED ORDER — DIPHENOXYLATE-ATROPINE 2.5-0.025 MG PO TABS: 2.0000 | ORAL_TABLET | Freq: Four times a day (QID) | ORAL | 0 refills | Status: DC | PRN

## 2017-06-01 MED ORDER — SODIUM CHLORIDE 0.9 % IV BOLUS (SEPSIS)
1000.0000 mL | Freq: Once | INTRAVENOUS | Status: AC
  Administered 2017-06-01: 1000 mL via INTRAVENOUS

## 2017-06-01 MED ORDER — ALPRAZOLAM 0.5 MG PO TABS
1.0000 mg | ORAL_TABLET | Freq: Once | ORAL | Status: AC
  Administered 2017-06-01: 1 mg via ORAL
  Filled 2017-06-01: qty 2

## 2017-06-01 NOTE — ED Notes (Signed)
Pt ambulatory to waiting room. Pt verbalized understanding of discharge instructions.   

## 2018-01-23 ENCOUNTER — Emergency Department (HOSPITAL_COMMUNITY)
Admission: EM | Admit: 2018-01-23 | Discharge: 2018-01-23 | Disposition: A | Discharge status: Home / Self Care | Payer: Self-pay | Attending: Emergency Medicine | Admitting: Emergency Medicine

## 2018-01-23 ENCOUNTER — Other Ambulatory Visit: Payer: Self-pay

## 2018-01-23 ENCOUNTER — Encounter (HOSPITAL_COMMUNITY): Payer: Self-pay | Admitting: Emergency Medicine

## 2018-01-23 ENCOUNTER — Emergency Department (HOSPITAL_COMMUNITY): Payer: Self-pay

## 2018-01-23 DIAGNOSIS — Z79899 Other long term (current) drug therapy: Secondary | ICD-10-CM | POA: Insufficient documentation

## 2018-01-23 DIAGNOSIS — J45909 Unspecified asthma, uncomplicated: Secondary | ICD-10-CM | POA: Insufficient documentation

## 2018-01-23 DIAGNOSIS — N451 Epididymitis: Secondary | ICD-10-CM | POA: Insufficient documentation

## 2018-01-23 DIAGNOSIS — F17200 Nicotine dependence, unspecified, uncomplicated: Secondary | ICD-10-CM | POA: Insufficient documentation

## 2018-01-23 LAB — URINALYSIS, ROUTINE W REFLEX MICROSCOPIC
BACTERIA UA: NONE SEEN
Bilirubin Urine: NEGATIVE
Glucose, UA: NEGATIVE mg/dL
KETONES UR: 20 mg/dL — AB
Nitrite: NEGATIVE
PROTEIN: NEGATIVE mg/dL
Specific Gravity, Urine: 1.018 (ref 1.005–1.030)
pH: 5 (ref 5.0–8.0)

## 2018-01-23 MED ORDER — LEVOFLOXACIN 500 MG PO TABS: 500.0000 mg | ORAL_TABLET | Freq: Every day | ORAL | 0 refills | Status: DC

## 2018-01-23 NOTE — ED Provider Notes (Signed)
[facility] EMERGENCY DEPARTMENT Provider Note   CSN: [id number] Arrival date & time: 01/23/18  1905     History   Chief Complaint Chief Complaint  Patient presents with  . Groin Swelling    HPI Jordan Pugh is a 37 y.o. male.  HPI   He presents for evaluation of right testicle pain with swelling, present since "today."  Prior to that he has had general achiness, fever and chills for 3 days.  He denies dysuria, urinary frequency, urethral discharge, abdominal or back pain.  No prior similar problems.  There are no other known modifying factors.  Past Medical History:  Diagnosis Date  . Anxiety    anxiety and panic disorder  . Asthma    childhood    Patient Active Problem List   Diagnosis Date Noted  . Sinusitis 08/16/2010  . INSOMNIA, CHRONIC 03/23/2010  . 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 DYSLIPIDEMIA 01/22/2010  . ARM PAIN, RIGHT 06/17/2009  . TOBACCO ABUSE 12/24/2007  . ANXIETY DISORDER 12/04/2007  . CHEST PAIN 12/04/2007    History reviewed. No pertinent surgical history.      Home Medications    Prior to Admission medications   Medication Sig Start Date End Date Taking? Authorizing Provider  ALPRAZolam (XANAX) 0.25 MG tablet Take 0.25 mg by mouth at bedtime as needed. Sleep/anxiety 05/08/15   [provider]  ciprofloxacin (CILOXAN) 0.3 % ophthalmic solution Place 1 drop into the right eye every 2 (two) hours. Administer 1 drop, every 2 hours, while awake, for 2 days. Then 1 drop, every 4 hours, while awake, for the next 5 days. Patient not taking: Reported on 05/14/2015 04/01/15   Shade Flood, MD  diphenoxylate-atropine (LOMOTIL) 2.5-0.025 MG tablet Take 2 tablets by mouth 4 (four) times daily as needed for diarrhea or loose stools. 06/01/17   Gilda Crease, MD  escitalopram (LEXAPRO) 10 MG tablet Take 10 mg by mouth daily.    [provider]  fish oil-omega-3 fatty acids 1000 MG capsule Take 2 g by mouth 2 (two) times daily.       [provider]  ipratropium (ATROVENT) 0.03 % nasal spray Place 2 sprays into the nose 2 (two) times daily. Patient not taking: Reported on 05/14/2015 10/24/12   Nelva Nay, PA-C  lisinopril (PRINIVIL,ZESTRIL) 20 MG tablet Take 20 mg by mouth daily.    [provider]  Multiple Vitamins-Minerals (MULTIVITAMINS THER. W/MINERALS) TABS Take 1 tablet by mouth daily.      [provider]  ondansetron (ZOFRAN) 4 MG tablet Take 1 tablet (4 mg total) by mouth every 6 (six) hours. 06/01/17   Gilda Crease, MD  VIAGRA 50 MG tablet Take 50 mg by mouth daily as needed. For ED. 03/02/15   [provider]  ZUBSOLV 5.7-1.4 MG SUBL Place 1 tablet under the tongue every 12 (twelve) hours. 05/12/15   [provider]    Family History Family History  Problem Relation Age of Onset  . Alcohol abuse Father   . Drug abuse Father        cocaine  . Heart disease Father        MI  . COPD Other   . Other Other        lung tumor    Social History Social History   Tobacco Use  . Smoking status: Current Every Day Smoker    Packs/day: 0.00  . Smokeless tobacco: Never Used  Substance Use Topics  . Alcohol  use: Yes    Comment: social  . Drug use: No     Allergies   Patient has no known allergies.   Review of Systems Review of Systems  All other systems reviewed and are negative.    Physical Exam Updated Vital Signs BP 119/84 (BP Location: Left Arm)   Pulse 88   Temp 98.5 F (36.9 C) (Oral)   Resp 14   Ht 6' (1.829 m)   Wt 84.4 kg   SpO2 98%   BMI 25.23 kg/m   Physical Exam  Constitutional: He is oriented to person, place, and time. He appears well-developed and well-nourished. He appears distressed (Uncomfortable).  HENT:  Head: Normocephalic and atraumatic.  Right Ear: External ear normal.  Left Ear: External ear normal.  Eyes: Pupils are equal, round, and reactive to light. Conjunctivae and EOM are normal.  Neck: Normal range  of motion and phonation normal. Neck supple.  Cardiovascular: Normal rate.  Pulmonary/Chest: Effort normal. He exhibits no bony tenderness.  Abdominal: Soft. There is no tenderness.  Genitourinary:  Genitourinary Comments: Normal penis.  No urethral discharge.  Normal left testicle and left hemiscrotum.  Mass in right testicle, consistent with enlargement of the testicle, very tender to touch, about 3 times normal size.  No associated inguinal abnormality, mass or lesion.  Musculoskeletal: Normal range of motion.  Neurological: He is alert and oriented to person, place, and time. No cranial nerve deficit or sensory deficit. He exhibits normal muscle tone. Coordination normal.  Skin: Skin is warm, dry and intact.  Psychiatric: He has a normal mood and affect. His behavior is normal. Judgment and thought content normal.  Nursing note and vitals reviewed.    ED Treatments / Results  Labs (all labs ordered are listed, but only abnormal results are displayed) Labs Reviewed  URINALYSIS, ROUTINE W REFLEX MICROSCOPIC - Abnormal; Notable for the following components:      Result Value   APPearance HAZY (*)    Hgb urine dipstick SMALL (*)    Ketones, ur 20 (*)    Leukocytes, UA TRACE (*)    All other components within normal limits    EKG None  Radiology No results found.   Testicular ultrasound ordered to rule out torsion or testicular compromise.  Procedures Procedures (including critical care time)  Medications Ordered in ED Medications - No data to display   Initial Impression / Assessment and Plan / ED Course  I have reviewed the triage vital signs and the nursing notes.  Pertinent labs & imaging results that were available during my care of the patient were reviewed by me and considered in my medical decision making (see chart for details).      No data found.   Medical Decision Making: Acute testicular and scrotal pain, concerning for infection or torsion.  Doubt  hernia.  Urgent ultrasound ordered to evaluate for scrotal vascular distress.  CRITICAL CARE-no Performed by: Mancel Bale   Nursing Notes Reviewed/ Care Coordinated Applicable Imaging Reviewed Interpretation of Laboratory Data incorporated into ED treatment   Care to oncoming provider team to disposition after ultrasound imaging.   Final Clinical Impressions(s) / ED Diagnoses   Final diagnoses:  Epididymitis    ED Discharge Orders    None       Mancel Bale, MD 01/26/18 1443

## 2018-01-23 NOTE — ED Notes (Signed)
ED Provider at bedside. 

## 2018-01-23 NOTE — ED Triage Notes (Signed)
Pt c/o fever x 4 days and testicular swelling that started yesterday, pt states right testicle is painful, fist sized and warm to touch

## 2018-08-17 ENCOUNTER — Emergency Department (HOSPITAL_COMMUNITY)
Admission: EM | Admit: 2018-08-17 | Discharge: 2018-08-17 | Disposition: A | Discharge status: Home / Self Care | Payer: Self-pay | Attending: Emergency Medicine | Admitting: Emergency Medicine

## 2018-08-17 ENCOUNTER — Other Ambulatory Visit: Payer: Self-pay

## 2018-08-17 ENCOUNTER — Emergency Department (HOSPITAL_COMMUNITY): Payer: Self-pay

## 2018-08-17 ENCOUNTER — Encounter (HOSPITAL_COMMUNITY): Payer: Self-pay | Admitting: Emergency Medicine

## 2018-08-17 DIAGNOSIS — J45909 Unspecified asthma, uncomplicated: Secondary | ICD-10-CM | POA: Insufficient documentation

## 2018-08-17 DIAGNOSIS — F17210 Nicotine dependence, cigarettes, uncomplicated: Secondary | ICD-10-CM | POA: Insufficient documentation

## 2018-08-17 DIAGNOSIS — Z79899 Other long term (current) drug therapy: Secondary | ICD-10-CM | POA: Insufficient documentation

## 2018-08-17 DIAGNOSIS — B349 Viral infection, unspecified: Secondary | ICD-10-CM | POA: Insufficient documentation

## 2018-08-17 HISTORY — DX: Other psychoactive substance dependence, uncomplicated: F19.20

## 2018-08-17 MED ORDER — PROMETHAZINE-DM 6.25-15 MG/5ML PO SYRP: 5.0000 mL | ORAL_SOLUTION | Freq: Four times a day (QID) | ORAL | 0 refills | Status: DC | PRN

## 2018-08-17 NOTE — Discharge Instructions (Addendum)
Your chest X Ray today was negative for pneumonia.  You may use your albuterol nebulizer, one treatment every 4-6 hrs as needed.  You have been given instructions for home quarantine.  Return to ER if you develop increasing shortness of breath or worsening cough.

## 2018-08-17 NOTE — ED Notes (Signed)
Unable to obtain discharge signature due to e-signature pad not working in room.  Pt verbalized understanding of d/c instructions after review.

## 2018-08-17 NOTE — ED Provider Notes (Signed)
[facility] EMERGENCY DEPARTMENT Provider Note   CSN: [id number] Arrival date & time: 08/17/18  2033    History   Chief Complaint Chief Complaint  Patient presents with  . Fever  . Cough    HPI Jordan Pugh is a 38 y.o. male.     HPI   Jordan Pugh is a 38 y.o. male who presents to the Emergency Department complaining of fever, cough, generalized body aches and nausea.  Symptoms began around 3:00 am this morning.  He describes headache this morning associated with the fever and headache resolved after fever improved.  His cough has been productive of yellow sputum.  He has been alternating tylenol and ibuprofen for his fever with last dose at 6:30 pm this evening.  Max fever of 101.2 this morning.  He states that he is employed by a company that [employer] and believes that he may have inhaled some particles.  He states that he has been wearing a face mask and his supervisor performs daily temperature checks.  He denies shortness of breath, chest pain,  vomiting, diarrhea, and loss of taste or smell. No neck pain or stiffness.  No known COVID exposures.      Past Medical History:  Diagnosis Date  . Anxiety    anxiety and panic disorder  . Asthma    childhood  . Drug dependence [facility])     Patient Active Problem List   Diagnosis Date Noted  . Sinusitis 08/16/2010  . INSOMNIA, CHRONIC 03/23/2010  . 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 DYSLIPIDEMIA 01/22/2010  . ARM PAIN, RIGHT 06/17/2009  . TOBACCO ABUSE 12/24/2007  . ANXIETY DISORDER 12/04/2007  . CHEST PAIN 12/04/2007    History reviewed. No pertinent surgical history.      Home Medications    Prior to Admission medications   Medication Sig Start Date End Date Taking? Authorizing Provider  ALPRAZolam (XANAX) 0.25 MG tablet Take 0.25 mg by mouth at bedtime as needed. Sleep/anxiety 05/08/15   [provider]  ciprofloxacin (CILOXAN) 0.3 % ophthalmic solution Place 1 drop into the right eye  every 2 (two) hours. Administer 1 drop, every 2 hours, while awake, for 2 days. Then 1 drop, every 4 hours, while awake, for the next 5 days. Patient not taking: Reported on 05/14/2015 04/01/15   Shade Flood, MD  diphenoxylate-atropine (LOMOTIL) 2.5-0.025 MG tablet Take 2 tablets by mouth 4 (four) times daily as needed for diarrhea or loose stools. 06/01/17   Gilda Crease, MD  escitalopram (LEXAPRO) 10 MG tablet Take 10 mg by mouth daily.    [provider]  fish oil-omega-3 fatty acids 1000 MG capsule Take 2 g by mouth 2 (two) times daily.      [provider]  ipratropium (ATROVENT) 0.03 % nasal spray Place 2 sprays into the nose 2 (two) times daily. Patient not taking: Reported on 05/14/2015 10/24/12   Nelva Nay, PA-C  levofloxacin (LEVAQUIN) 500 MG tablet Take 1 tablet (500 mg total) by mouth daily. 01/23/18   Raeford Razor, MD  lisinopril (PRINIVIL,ZESTRIL) 20 MG tablet Take 20 mg by mouth daily.    [provider]  Multiple Vitamins-Minerals (MULTIVITAMINS THER. W/MINERALS) TABS Take 1 tablet by mouth daily.      [provider]  ondansetron (ZOFRAN) 4 MG tablet Take 1 tablet (4 mg total) by mouth every 6 (six) hours. 06/01/17   Gilda Crease, MD  VIAGRA 50 MG tablet Take 50 mg by mouth  daily as needed. For ED. 03/02/15   [provider]  ZUBSOLV 5.7-1.4 MG SUBL Place 1 tablet under the tongue every 12 (twelve) hours. 05/12/15   [provider]    Family History Family History  Problem Relation Age of Onset  . Alcohol abuse Father   . Drug abuse Father        cocaine  . Heart disease Father        MI  . COPD Other   . Other Other        lung tumor    Social History Social History   Tobacco Use  . Smoking status: Current Every Day Smoker    Packs/day: 0.00  . Smokeless tobacco: Never Used  Substance Use Topics  . Alcohol use: Yes    Comment: social  . Drug use: No    Comment: clean x 2.5 yrs      Allergies   Patient has no known allergies.   Review of Systems Review of Systems  Constitutional: Positive for chills and fever. Negative for activity change and appetite change.  HENT: Negative for congestion, facial swelling, rhinorrhea, sore throat and trouble swallowing.   Eyes: Negative for visual disturbance.  Respiratory: Positive for cough. Negative for shortness of breath, wheezing and stridor.   Cardiovascular: Negative for chest pain.  Gastrointestinal: Positive for nausea. Negative for abdominal pain and vomiting.  Genitourinary: Negative for dysuria and flank pain.  Musculoskeletal: Positive for myalgias. Negative for neck pain and neck stiffness.  Skin: Negative for rash.  Neurological: Positive for headaches. Negative for dizziness, weakness and numbness.  Hematological: Negative for adenopathy.  Psychiatric/Behavioral: Negative for confusion.     Physical Exam Updated Vital Signs BP (!) 156/96 (BP Location: Left Arm)   Pulse (!) 18   Temp 98.3 F (36.8 C) (Oral)   Resp 18   SpO2 98%   Physical Exam Vitals signs and nursing note reviewed.  Constitutional:      General: He is not in acute distress.    Appearance: Normal appearance. He is not ill-appearing.  HENT:     Right Ear: Tympanic membrane and ear canal normal.     Left Ear: Tympanic membrane and ear canal normal.     Nose: Nose normal.  Neck:     Musculoskeletal: Normal range of motion. No neck rigidity.  Cardiovascular:     Rate and Rhythm: Normal rate and regular rhythm.     Pulses: Normal pulses.  Pulmonary:     Effort: Pulmonary effort is normal. No respiratory distress.     Breath sounds: Normal breath sounds. No wheezing.  Chest:     Chest wall: No tenderness.  Abdominal:     General: There is no distension.     Palpations: Abdomen is soft.     Tenderness: There is no abdominal tenderness. There is no guarding.  Musculoskeletal: Normal range of motion.  Lymphadenopathy:     Cervical:  No cervical adenopathy.  Skin:    General: Skin is warm.     Capillary Refill: Capillary refill takes less than 2 seconds.     Findings: No rash.  Neurological:     General: No focal deficit present.     Mental Status: He is alert.     Sensory: No sensory deficit.     Motor: No weakness.      ED Treatments / Results  Labs (all labs ordered are listed, but only abnormal results are displayed) Labs Reviewed - No data to  display  EKG None  Radiology Dg Chest Portable 1 View  Result Date: 08/17/2018 CLINICAL DATA:  Fever with productive cough since yesterday. EXAM: PORTABLE CHEST 1 VIEW COMPARISON:  Radiographs 06/01/2017 and 05/14/2015. FINDINGS: 2057 hours. The heart size and mediastinal contours are normal. The lungs are clear. There is no pleural effusion or pneumothorax. No acute osseous findings are identified. IMPRESSION: Stable chest.  No active cardiopulmonary process. Electronically Signed   By: Carey BullocksWilliam  Veazey M.D.   On: 08/17/2018 21:13    Procedures Procedures (including critical care time)  Medications Ordered in ED Medications - No data to display   Initial Impression / Assessment and Plan / ED Course  I have reviewed the triage vital signs and the nursing notes.  Pertinent labs & imaging results that were available during my care of the patient were reviewed by me and considered in my medical decision making (see chart for details).        Pt with fever and cough for less than 24 hrs.  CXR reassuring.  No hypoxia or dyspnea.  Non-toxic appearing.  Vitals reviewed.  Pt has albuterol nebulizer at home, agrees to neb tx every 4-6 hrs as needed and he will continue to monitor temp.  MDM includes bronchitis, viral illness and  COVID.  Pt agrees to self quarantine and monitor sx's closely at home. Strict return precautions discussed.     Final Clinical Impressions(s) / ED Diagnoses   Final diagnoses:  Viral illness    ED Discharge Orders    None        Rosey Bathriplett, Onnie Alatorre, PA-C 08/17/18 2151    Linwood DibblesKnapp, Jon, MD 08/19/18 1317

## 2018-08-17 NOTE — ED Triage Notes (Signed)
Pt c/o fever. Prod cough with yelllow sputum since yesterday morning. C/o head and neck aches but no body aches. nad at this time. Took ibuprofen 1.5 hrs ago. No resp distress or sob noted. Pt denies being sob. Denies being around anyone with known covid.

## 2019-05-09 IMAGING — CT CT ABD-PELV W/ CM
2 of 4 series · 16 of 46 positions shown, 18 images · IV contrast (Isovue)
Comparison: None.

CLINICAL DATA: Initial evaluation for intermittent abdominal pain,
vomiting.

EXAM:
CT ABDOMEN AND PELVIS WITH CONTRAST
TECHNIQUE: Multidetector CT imaging of the abdomen and pelvis was performed
using the standard protocol following bolus administration of
intravenous contrast.
CONTRAST:  100mL 1NWFKB-QEE IOPAMIDOL (1NWFKB-QEE) INJECTION 61%

[Series 2: axial st · axial · 0.78mm/px · z∈[+1004,+1469]mm · 13 of 103 slices shown, 15 images]
[im 5/103  soft-tissue]
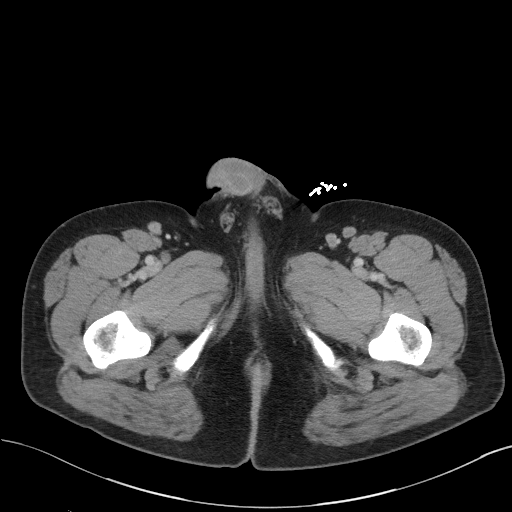
[im 5/103  bone]
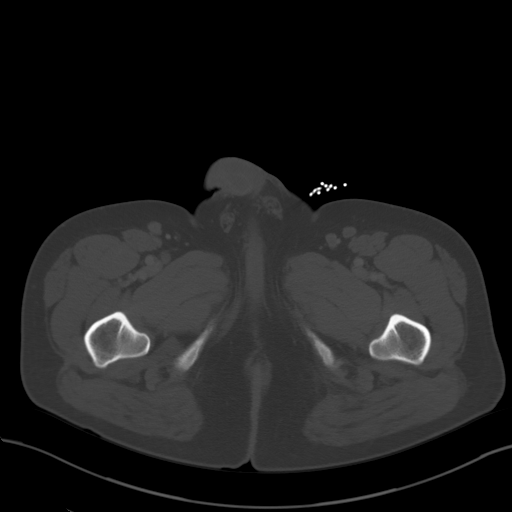
[im 14/103  soft-tissue]
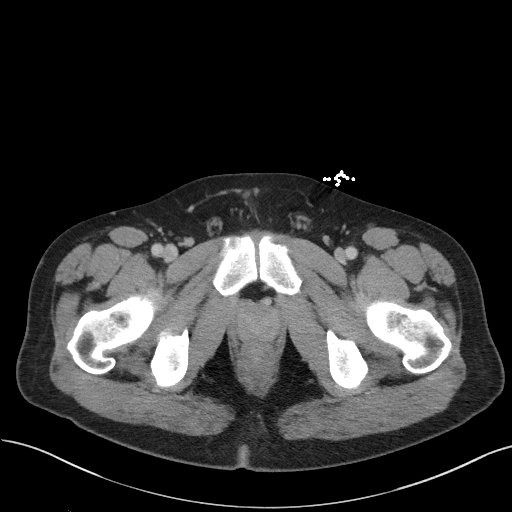
[im 24/103  soft-tissue]
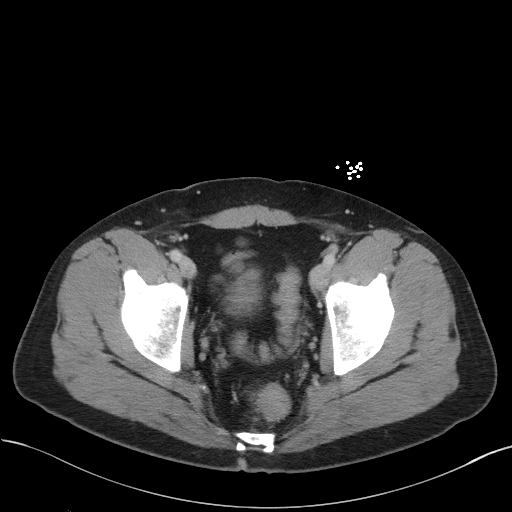
[im 28/103  soft-tissue]
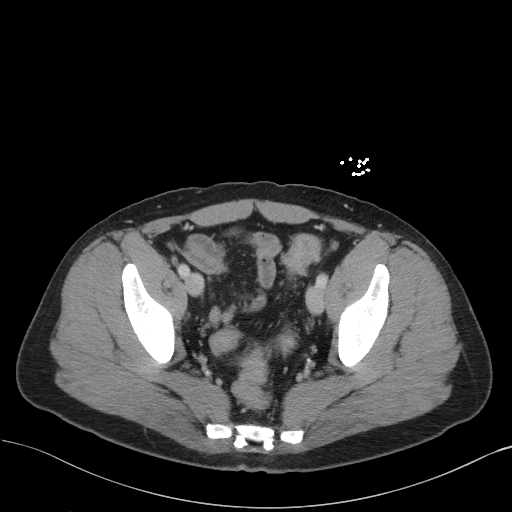
[im 38/103  soft-tissue]
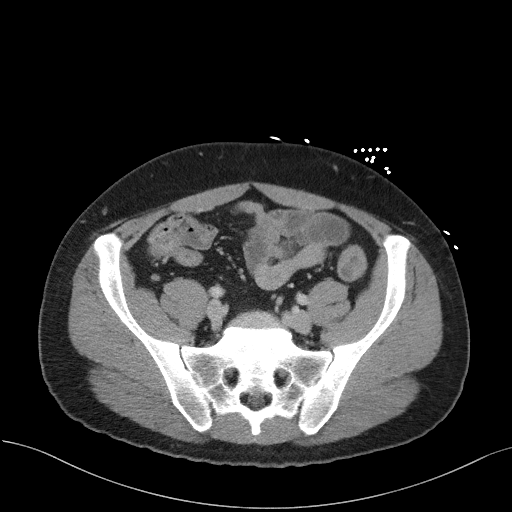
[im 42/103  soft-tissue]
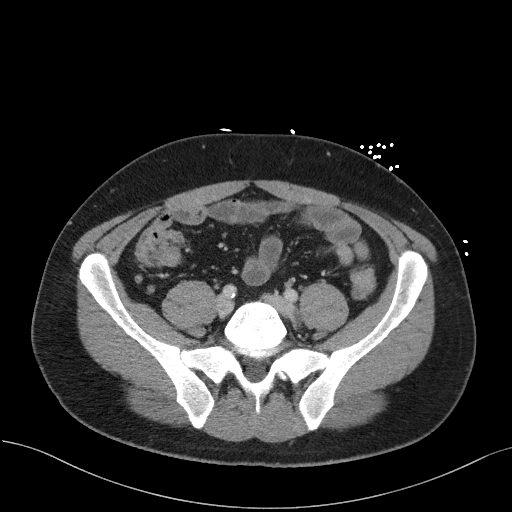
[im 52/103  soft-tissue]
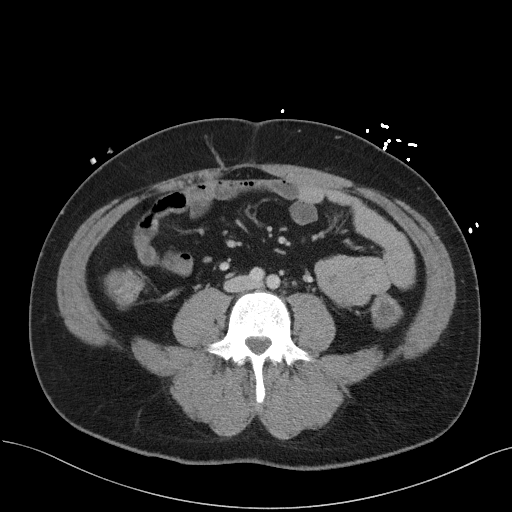
[im 61/103  soft-tissue]
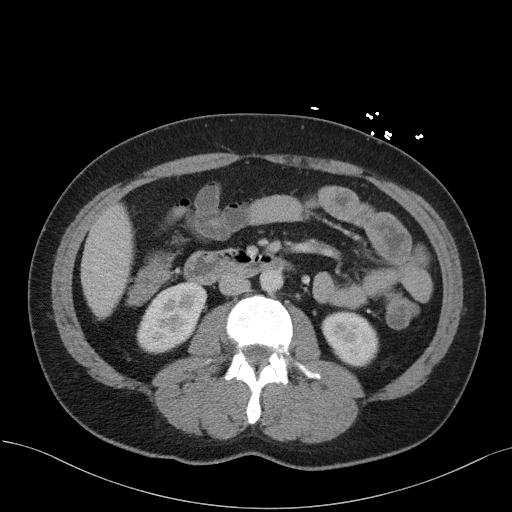
[im 65/103  soft-tissue]
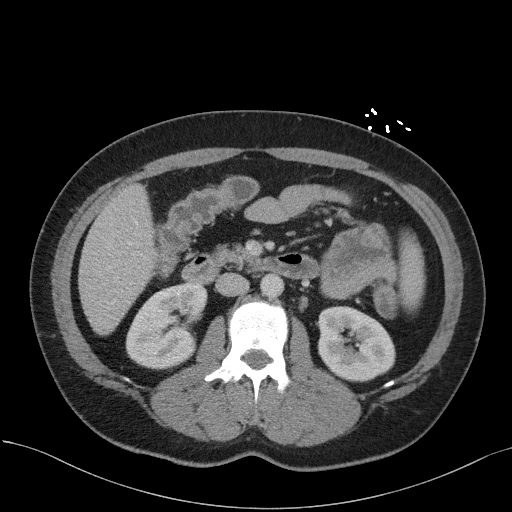
[im 65/103  bone]
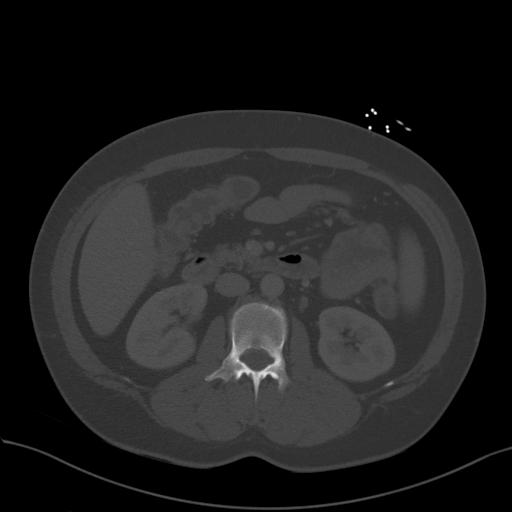
[im 75/103  soft-tissue]
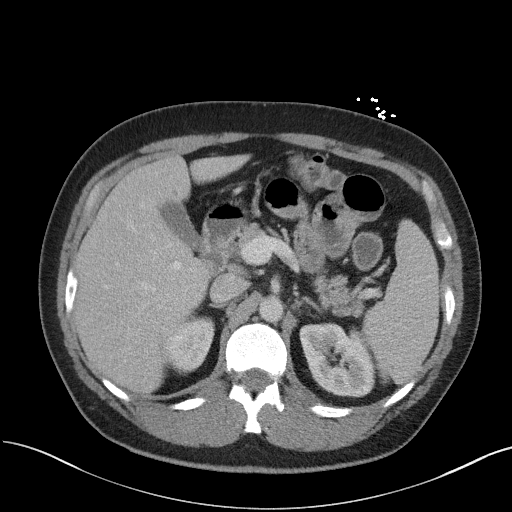
[im 79/103  soft-tissue]
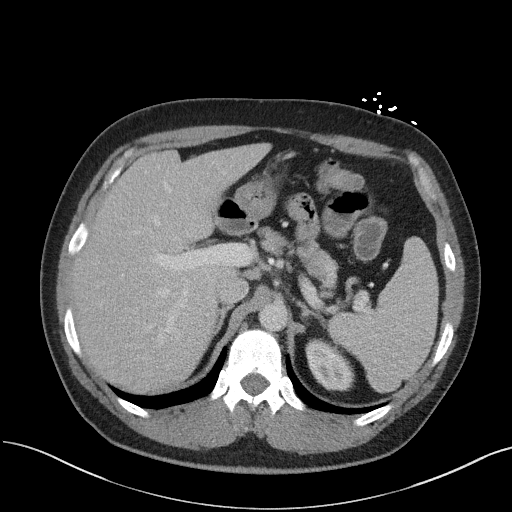
[im 89/103  soft-tissue]
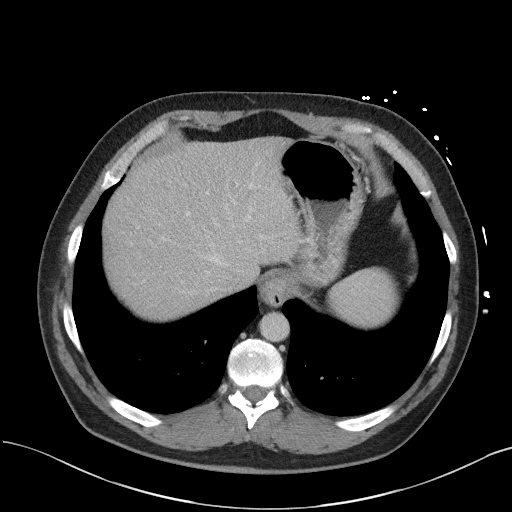
[im 98/103  soft-tissue]
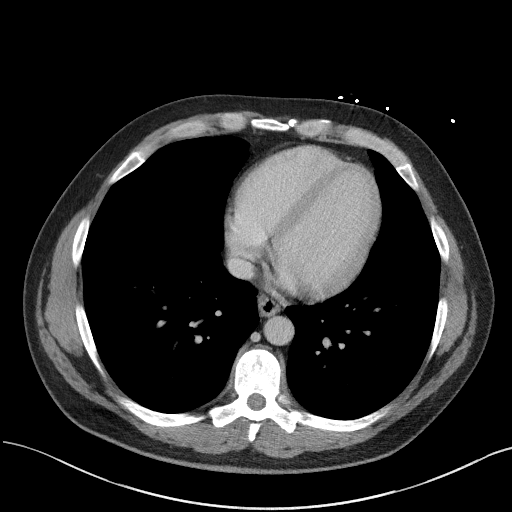

[Series 5: coronal st · coronal · 0.89mm/px · 3 of 92 slices shown]
[im 31/92  soft-tissue]
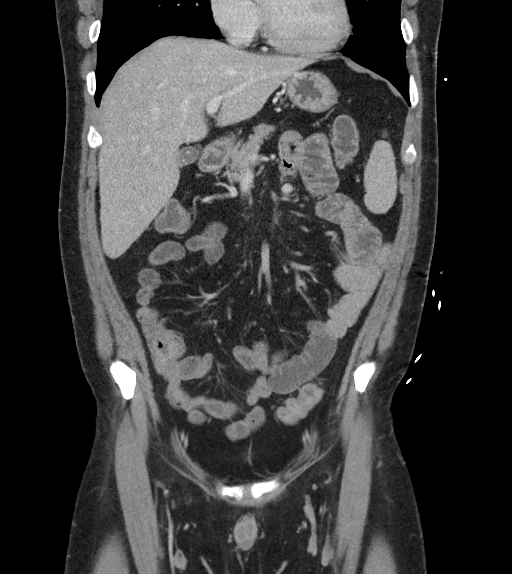
[im 41/92  soft-tissue]
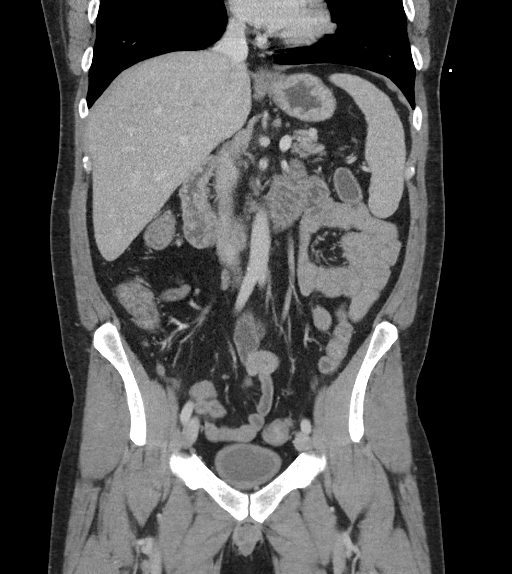
[im 51/92  soft-tissue]
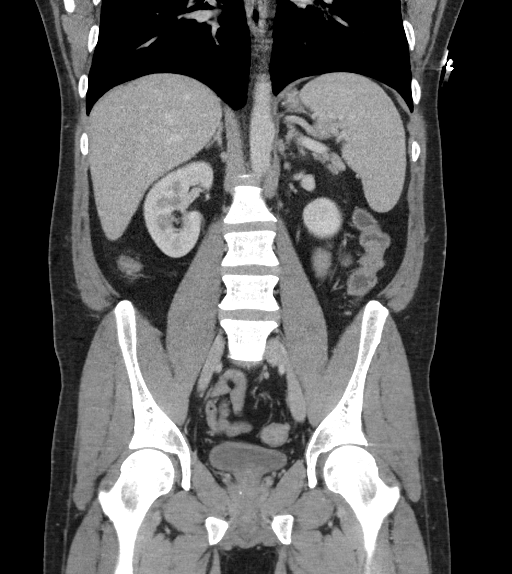

[16 of 46 positions shown; findings below may reference images not displayed]

FINDINGS: Lower chest: Visualized lung bases are clear.

Hepatobiliary: Liver demonstrates a normal contrast enhanced
appearance. Focal fat deposition noted adjacent to the falciform
ligament. Gallbladder within normal limits. No biliary dilatation.

Pancreas: Pancreas within normal limits.

Spleen: Spleen within normal limits.

Adrenals/Urinary Tract: Adrenal glands are normal. Kidneys equal in
size with symmetric enhancement. Punctate nonobstructive right renal
calculi measuring up to 3 mm noted. No left-sided calculi. No
hydronephrosis. No focal enhancing renal mass. No hydroureter.
Partially distended bladder within normal limits. Mild
circumferential bladder wall thickening most likely related
incomplete distension.

Stomach/Bowel: Small hiatal hernia noted. Stomach otherwise
unremarkable. No evidence for bowel obstruction. Appendix is normal.
Colon is largely decompressed. No acute inflammatory changes seen
about the bowels.

Vascular/Lymphatic: Normal intravascular enhancement seen throughout
the intra-abdominal aorta and its branch vessels. No pathologically
enlarged intra-abdominal or pelvic lymph nodes.

Reproductive: Prostate within normal limits.

Other: No free air or fluid.

Musculoskeletal: No acute osseous abnormality. No worrisome lytic or
blastic osseous lesions.
IMPRESSION: 1. No CT evidence for acute intra-abdominopelvic process.
2. Punctate nonobstructive right renal nephrolithiasis.

## 2019-05-28 ENCOUNTER — Ambulatory Visit: Payer: Self-pay | Attending: Internal Medicine

## 2019-05-28 ENCOUNTER — Other Ambulatory Visit: Payer: Self-pay

## 2019-05-28 DIAGNOSIS — Z20822 Contact with and (suspected) exposure to covid-19: Secondary | ICD-10-CM | POA: Insufficient documentation

## 2019-05-29 LAB — NOVEL CORONAVIRUS, NAA: SARS-CoV-2, NAA: NOT DETECTED

## 2019-12-31 IMAGING — US US SCROTUM W/ DOPPLER COMPLETE
1 series · 14 of 25 positions shown · non-contrast
Comparison: None.

CLINICAL DATA: Right testicular pain x4 days, fever

EXAM:
SCROTAL ULTRASOUND
DOPPLER ULTRASOUND OF THE TESTICLES
TECHNIQUE: Complete ultrasound examination of the testicles, epididymis, and
other scrotal structures was performed. Color and spectral Doppler
ultrasound were also utilized to evaluate blood flow to the
testicles.

[Series 1: us scrotum w/ doppler complete · 14 of 72 slices shown]
[im 1/72]
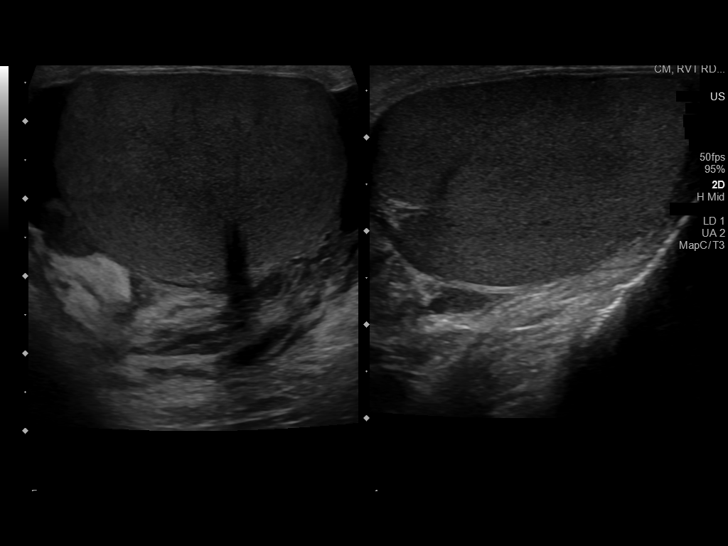
[im 6/72]
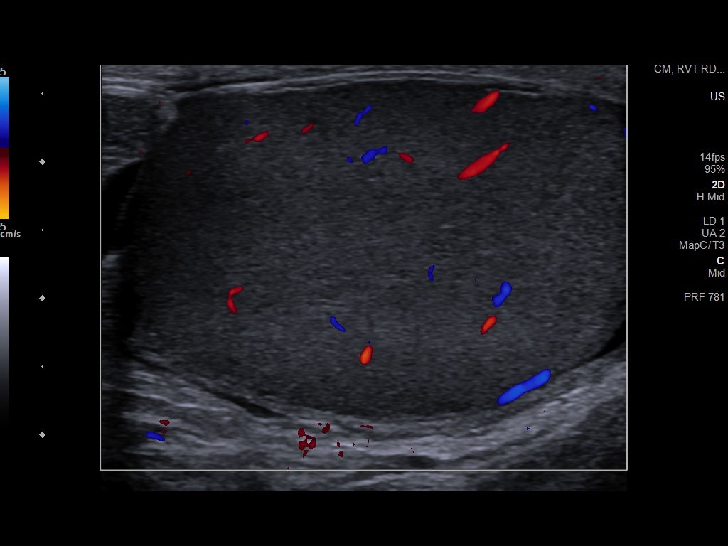
[im 12/72]
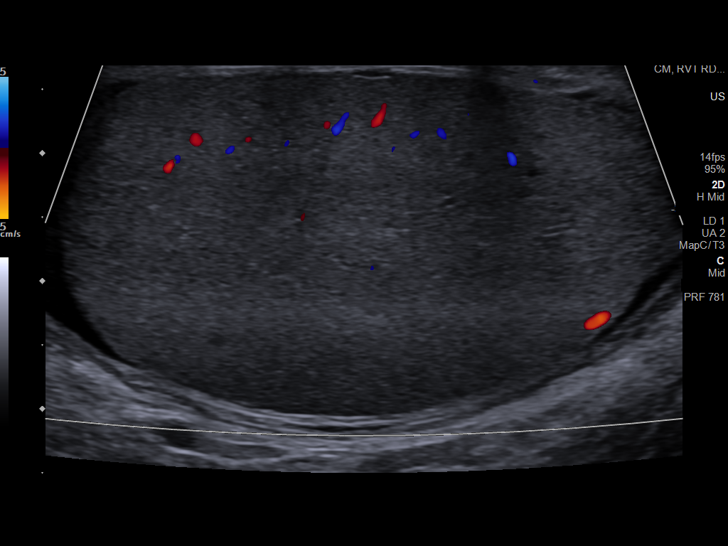
[im 18/72]
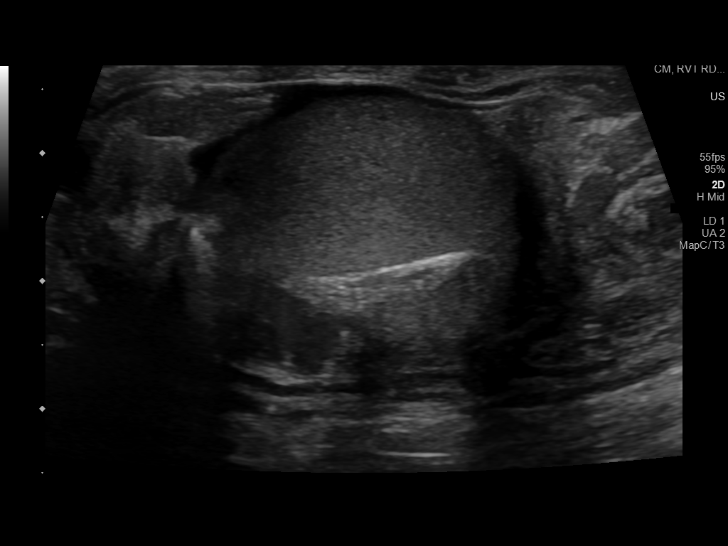
[im 24/72]
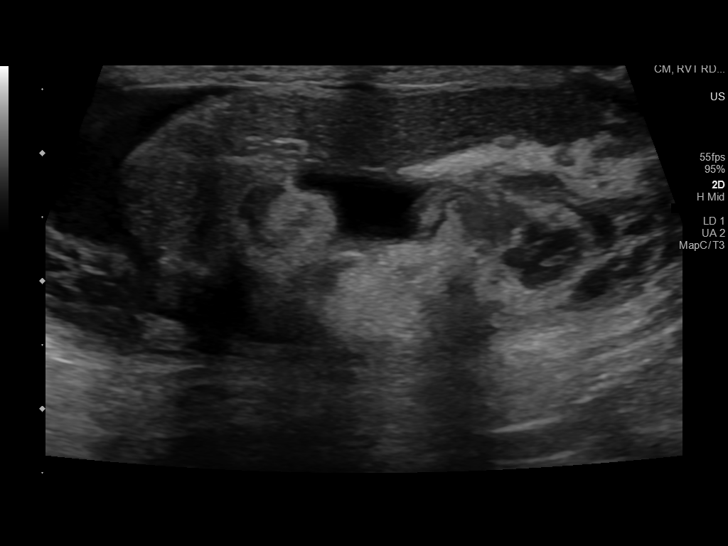
[im 27/72]
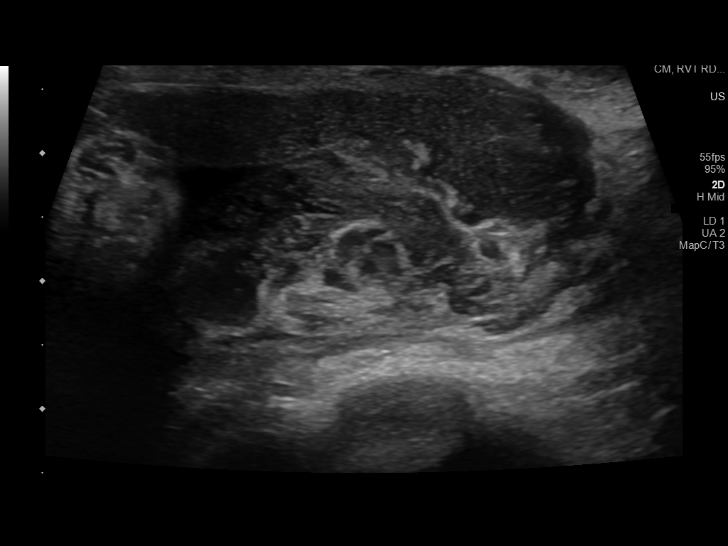
[im 33/72]
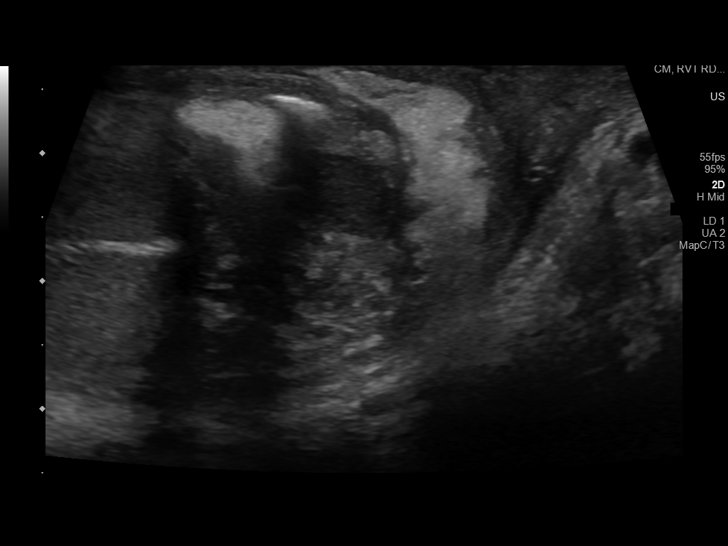
[im 39/72]
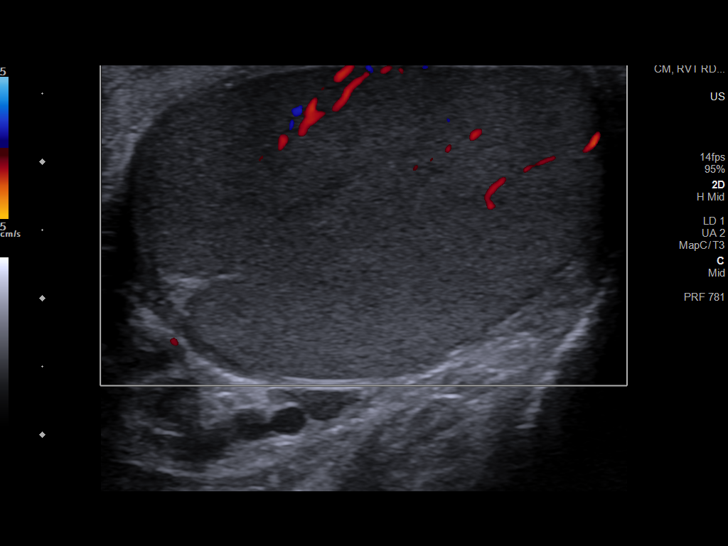
[im 45/72]
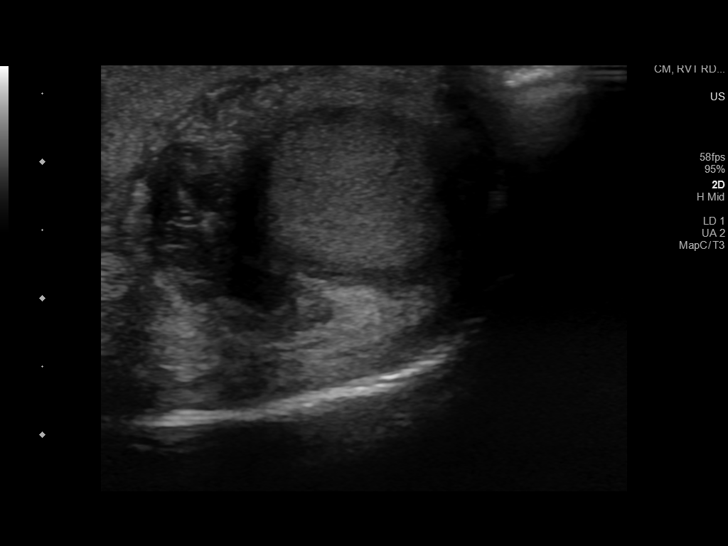
[im 48/72]
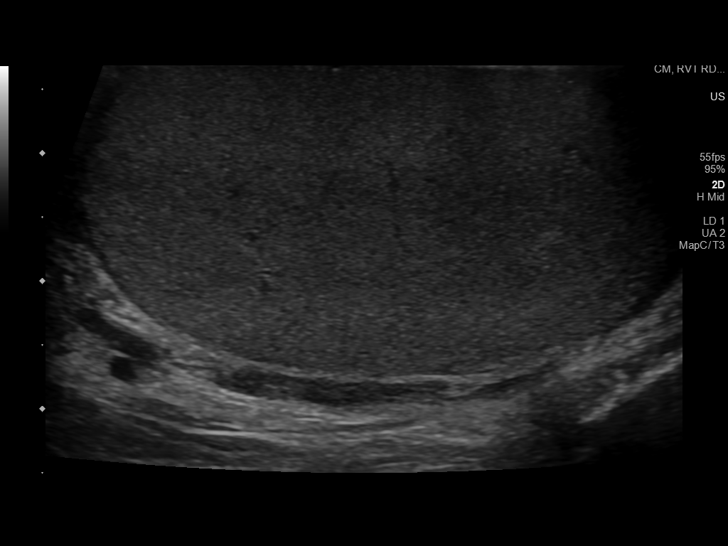
[im 54/72]
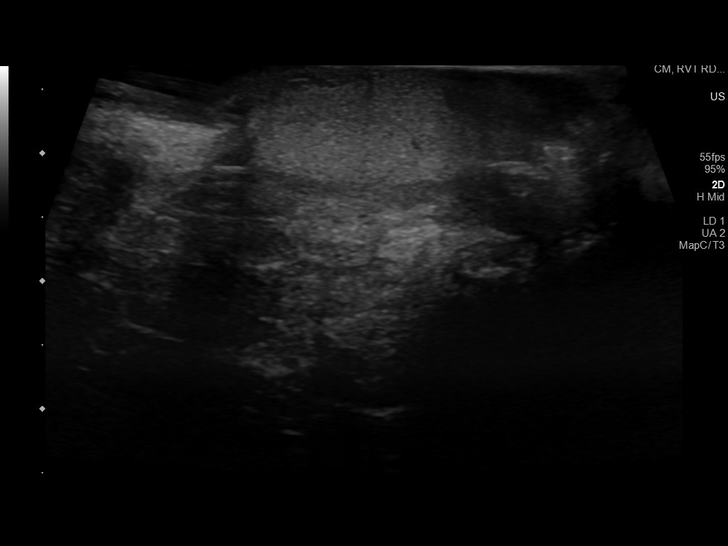
[im 60/72]
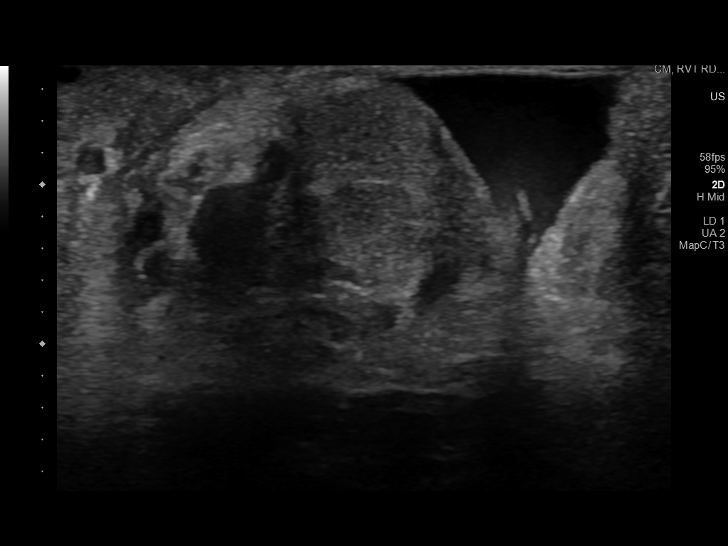
[im 66/72]
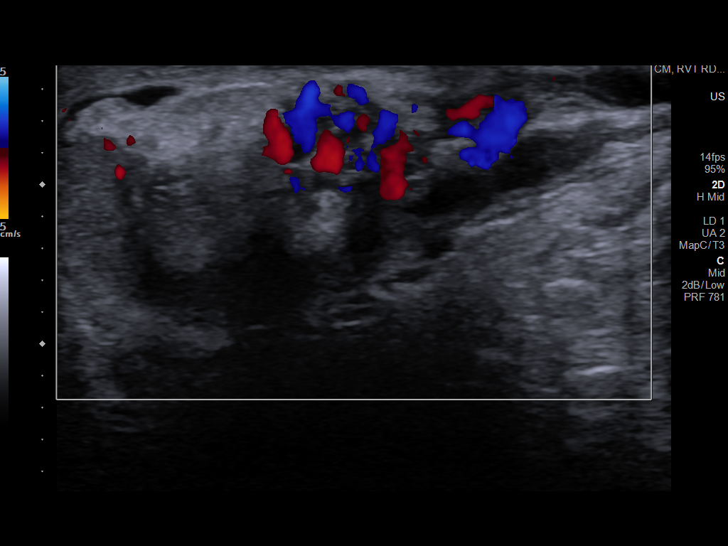
[im 72/72]
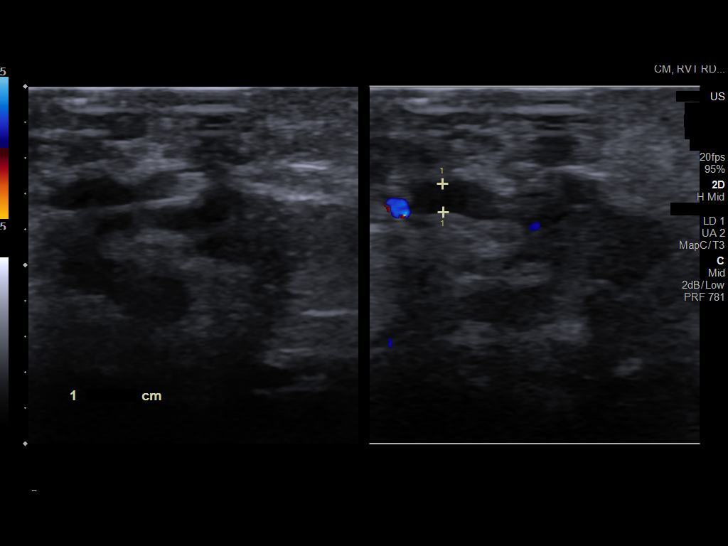

[14 of 25 positions shown; findings below may reference images not displayed]

FINDINGS: Right testicle

Measurements: 4.8 x 2.7 x 3.7 cm. No mass or microlithiasis
visualized.

Left testicle

Measurements: 4.6 x 2.5 x 3.4 cm. No mass or microlithiasis
visualized.

Right epididymis:  Heterogeneous and mildly hypervascular.

Left epididymis:  Normal in size and appearance.

Hydrocele:  Small right hydrocele

Varicocele:  None visualized.

Pulsed Doppler interrogation of both testes demonstrates normal low
resistance arterial and venous waveforms bilaterally.
IMPRESSION: Normal sonographic appearance of the bilateral testes.

No evidence of testicular torsion.

Mildly heterogeneous/hypervascular appearance of the right
epididymis with small right hydrocele, suggesting mild right
epididymitis.

## 2020-07-24 IMAGING — CR PORTABLE CHEST - 1 VIEW
1 series · 1 of 1 positions shown · non-contrast
Comparison: Radiographs 06/01/2017 and 05/14/2015.

CLINICAL DATA: Fever with productive cough since yesterday.

EXAM:
PORTABLE CHEST 1 VIEW

[portable]
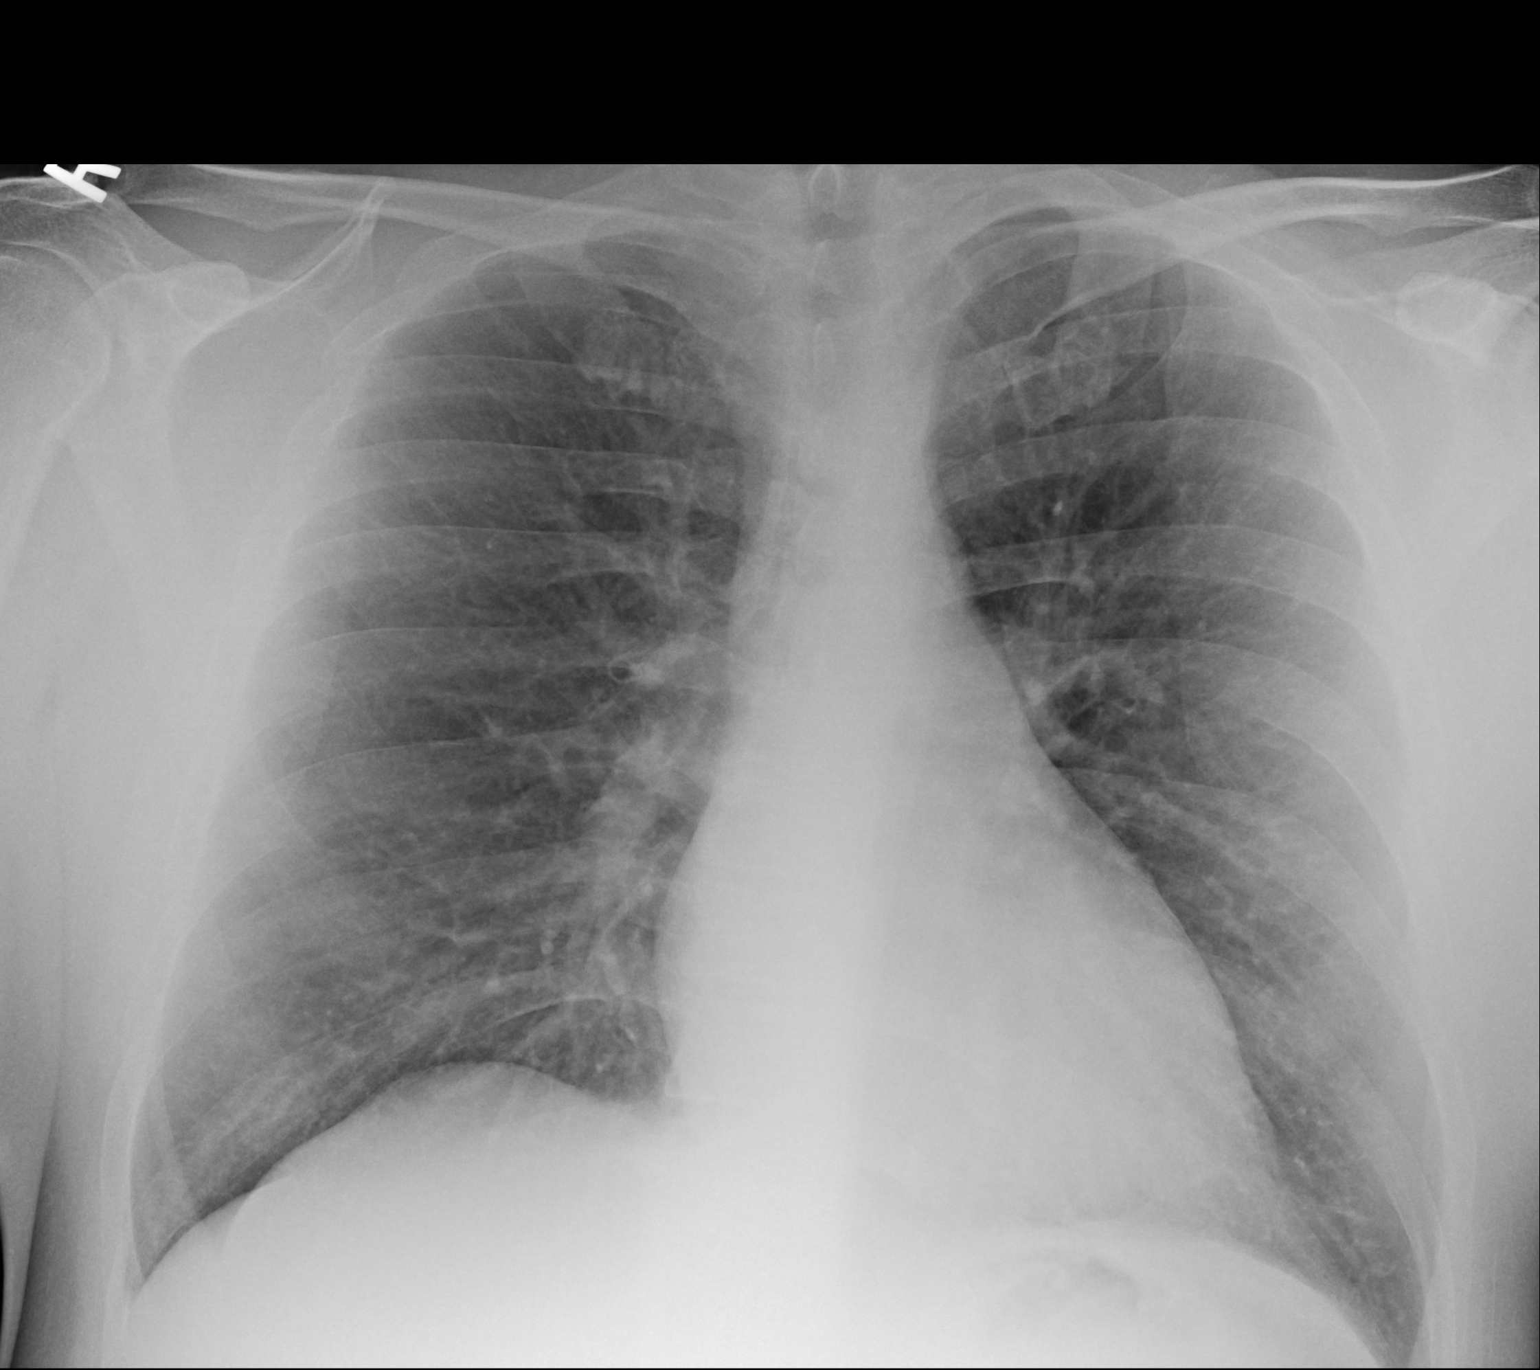

[1 of 1 positions shown; findings below may reference images not displayed]

FINDINGS: 7062 hours. The heart size and mediastinal contours are normal. The
lungs are clear. There is no pleural effusion or pneumothorax. No
acute osseous findings are identified.
IMPRESSION: Stable chest.  No active cardiopulmonary process.

## 2024-05-09 ENCOUNTER — Ambulatory Visit: Payer: Self-pay | Attending: Cardiology | Admitting: Cardiology

## 2024-05-09 ENCOUNTER — Encounter: Payer: Self-pay | Admitting: Cardiology

## 2024-05-09 VITALS — BP 130/88 | HR 68 | Resp 16 | Ht 72.0 in | Wt 264.0 lb

## 2024-05-09 DIAGNOSIS — R400 Somnolence: Secondary | ICD-10-CM | POA: Diagnosis not present

## 2024-05-09 DIAGNOSIS — R002 Palpitations: Secondary | ICD-10-CM

## 2024-05-09 DIAGNOSIS — E782 Mixed hyperlipidemia: Secondary | ICD-10-CM | POA: Diagnosis not present

## 2024-05-09 DIAGNOSIS — R0683 Snoring: Secondary | ICD-10-CM | POA: Diagnosis not present

## 2024-05-09 DIAGNOSIS — I10 Essential (primary) hypertension: Secondary | ICD-10-CM

## 2024-05-09 NOTE — Progress Notes (Signed)
 " Cardiology Office Note:  .   Date:  05/09/2024  ID:  Jordan Pugh, DOB [birth year], MRN [id number] PCP: Bobbette Coye LABOR, MD  [facility] Providers Cardiologist:  None   History of Present Illness: .   Jordan Pugh is a 44 y.o. male patient with hypertension, history of prior opioid use disorder presently on Suboxone and closely being monitored by his PCP, anxiety and restless leg syndrome referred to me for evaluation of palpitations.  About a month ago he had frequent episodes of palpitation described as skipped beats and was evaluated by his PCP and was found to have elevated blood pressure and was started on olmesartan 20 mg daily.  States that since then he has not had any further episodes of palpitations and is presently asymptomatic.    Discussed the use of AI scribe software for clinical note transcription with the patient, who gave verbal consent to proceed.  History of Present Illness Jordan Pugh is a 44 year old male who presents with heart racing symptoms. He is accompanied by his daughter, Maurilio. He was referred by Dr. Coye Dais for evaluation of heart racing symptoms.  Over the past several months he has had recurrent episodes of heart racing that previously were frequent and could last about a week. During episodes he felt very unwell with sweating, pallor, skipped beats, marked fatigue, fluctuating heart rates, and elevated blood pressure. He has not had an episode in the last four weeks.  During a prior symptomatic episode he had a twelve-lead ECG. He was treated with hydrochlorothiazide and lisinopril in the past for hypertension and is now taking olmesartan 20 mg. He has high cholesterol and triglycerides, with prior levels of total cholesterol 250 mg/dL, triglycerides 452 mg/dL, and HDL 29 mg/dL. His cholesterol and thyroid function have not been checked recently.  He weighs about 264-265 pounds and has been at this weight for several years. He snores,  has not been evaluated for sleep apnea, and feels very fatigued during the day, especially after lunch.  His father died at 45 from a congenital heart defect (PFO) and an uncle died from the same condition. He has a history of substance use but is currently drug-free.  Cardiac Studies relevent.       EKG:   EKG Interpretation Date/Time:  Thursday May 09 2024 14:04:09 EST Ventricular Rate:  68 PR Interval:  146 QRS Duration:  120 QT Interval:  424 QTC Calculation: 450 R Axis:   -15  Text Interpretation: EKG 05/09/2024: Normal sinus rhythm at rate of 68 bpm, normal axis, incomplete right bundle branch block.  Compared to 05/31/2017, no change. Confirmed by [place] Freyre, Jagadeesh [phone]) on 05/09/2024 2:38:23 PM  Labs   Care everywhere/Faxed External Labs:  Labs 07/06/2022:  Serum glucose 132 mg, BUN 14, creatinine 0.78, potassium 3.2, eGFR 114 mL.  Hb 14.2/HCT 40.8, platelets 259.  Labs 09/17/2021:  Total cholesterol 250, triglycerides 547, HDL 29, LDL 123.  ROS  Review of Systems  Cardiovascular:  Positive for palpitations. Negative for chest pain, dyspnea on exertion and leg swelling.   Physical Exam:   VS:  BP 130/88 (BP Location: Left Arm, Patient Position: Sitting, Cuff Size: Large)   Pulse 68   Resp 16   Ht 6' (1.829 m)   Wt 264 lb (119.7 kg)   SpO2 97%   BMI 35.80 kg/m    Wt Readings from Last 3 Encounters:  05/09/24 264 lb (119.7 kg)  01/23/18 186 lb (84.4  kg)  05/31/17 215 lb (97.5 kg)    BP Readings from Last 3 Encounters:  05/09/24 130/88  08/17/18 132/82  01/23/18 119/84   Physical Exam Constitutional:      Appearance: He is obese.  Neck:     Vascular: No carotid bruit or JVD.  Cardiovascular:     Rate and Rhythm: Normal rate and regular rhythm.     Pulses: Intact distal pulses.     Heart sounds: Normal heart sounds. No murmur heard.    No gallop.  Pulmonary:     Effort: Pulmonary effort is normal.     Breath sounds: Normal breath sounds.   Abdominal:     General: Bowel sounds are normal.     Palpations: Abdomen is soft.  Musculoskeletal:     Right lower leg: No edema.     Left lower leg: No edema.    ASSESSMENT AND PLAN: .      ICD-10-CM   1. Palpitations  R00.2 EKG 12-Lead    2. Primary hypertension  I10     3. Mixed hyperlipidemia  E78.2     4. Loud snoring  R06.83     5. Daytime somnolence  R40.0      Assessment & Plan Palpitations Intermittent palpitations with premature ventricular contractions (PVCs) have decreased in frequency over the past four weeks. Previous episodes were associated with sweating, pallor, and fatigue. Uncontrolled hypertension, stress, lack of sleep, and suspected sleep apnea may contribute to PVCs. - Continue current antihypertensive regimen with olmesartan 20 mg. - Ordered sleep study to evaluate for obstructive sleep apnea.  Primary hypertension Hypertension is managed with olmesartan 20 mg. Blood pressure control is crucial to prevent exacerbation of palpitations and other cardiovascular complications. - Continue olmesartan 20 mg daily.  Mixed hyperlipidemia Previous cholesterol levels were significantly elevated with triglycerides at 547 mg/dL and HDL at 29 mg/dL. LDL was marginally high. No recent cholesterol or thyroid function tests available. - Ordered lipid panel and thyroid function tests. - Advised dietary modifications to reduce carbohydrate intake.  Obesity Weight has been stable around 264-265 lbs for several years. Weight loss efforts have been challenging due to physiological mechanisms that maintain weight. Suspected sleep apnea may contribute to weight issues. - Discussed the role of sleep apnea in weight management.  Suspected obstructive sleep apnea Suspected due to snoring, obesity, and frequent PVCs. Diagnosis is important for managing weight and cardiovascular health. - Ordered sleep study to confirm diagnosis of obstructive sleep apnea.   Follow up: 2  months for palpitations, hypertension, hypercholesterolemia and follow-up sleep study  Signed,  Gordy Bergamo, MD, [facility] 05/09/2024, 2:53 PM [facility] [address] [place], [place] [address] Phone: [phone]. Fax:  [phone]  "

## 2024-05-09 NOTE — Patient Instructions (Signed)
 Medication Instructions:  No changes *If you need a refill on your cardiac medications before your next appointment, please call your pharmacy*  Lab Work: Lipid panel, TSH If you have labs (blood work) drawn today and your tests are completely normal, you will receive your results only by: MyChart Message (if you have MyChart) OR A paper copy in the mail If you have any lab test that is abnormal or we need to change your treatment, we will call you to review the results.  Testing/Procedures: WatchPAT?  Is a FDA cleared portable home sleep study test that uses a watch and 3 points of contact to monitor 7 different channels, including your heart rate, oxygen saturations, body position, snoring, and chest motion.  The study is easy to use from the comfort of your own home and accurately detect sleep apnea.  Before bed, you attach the chest sensor, attached the sleep apnea bracelet to your nondominant hand, and attach the finger probe.  After the study, the raw data is downloaded from the watch and scored for apnea events.   For more information: [email or web address]  Patient Testing Instructions:  Do not put battery into the device until bedtime when you are ready to begin the test. Please call the support number if you need assistance after following the instructions below: 24 hour support line- [phone] or [facility] support at [phone] (option 2)  Download the [facility] WatchPAT One app through the google play store or App Store  Be sure to turn on or enable access to bluetooth in settlings on your smartphone/ device  Make sure no other bluetooth devices are on and within the vicinity of your smartphone/ device and WatchPAT watch during testing.  Make sure to leave your smart phone/ device plugged in and charging all night.  When ready for bed:  Follow the instructions step by step in the WatchPAT One App to activate the testing device. For additional instructions,  including video instruction, visit the WatchPAT One video on Youtube. You can search for WatchPat One within Youtube (video is 4 minutes and 18 seconds) or enter: https://youtube/[email or web address] Please note: You will be prompted to enter a Pin to connect via bluetooth when starting the test. The PIN will be assigned to you when you receive the test.  The device is disposable, but it recommended that you retain the device until you receive a call letting you know the study has been received and the results have been interpreted.  We will let you know if the study did not transmit to us  properly after the test is completed. You do not need to call us  to confirm the receipt of the test.  Please complete the test within 48 hours of receiving PIN.   Frequently Asked Questions:  What is Watch Bruna one?  A single use fully disposable home sleep apnea testing device and will not need to be returned after completion.  What are the requirements to use WatchPAT one?  The be able to have a successful watchpat one sleep study, you should have your Watch pat one device, your smart phone, watch pat one app, your PIN number and Internet access What type of phone do I need?  You should have a smart phone that uses Android 5.1 and above or any Iphone with IOS 10 and above How can I download the WatchPAT one app?  Based on your device type search for WatchPAT one app either in google play for android devices or APP store for Iphone's Where  will I get my PIN for the study?  Your PIN will be provided by your physician's office. It is used for authentication and if you lose/forget your PIN, please reach out to your providers office.  I do not have Internet at home. Can I do WatchPAT one study?  WatchPAT One needs Internet connection throughout the night to be able to transmit the sleep data. You can use your home/local internet or your cellular's data package. However, it is always recommended to use home/local  Internet. It is estimated that between 20MB-30MB will be used with each study.However, the application will be looking for space in the phone to start the study.  What happens if I lose internet or bluetooth connection?  During the internet disconnection, your phone will not be able to transmit the sleep data. All the data, will be stored in your phone. As soon as the internet connection is back on, the phone will being sending the sleep data. During the bluetooth disconnection, WatchPAT one will not be able to to send the sleep data to your phone. Data will be kept in the WatchPAT one until two devices have bluetooth connection back on. As soon as the connection is back on, WatchPAT one will send the sleep data to the phone.  How long do I need to wear the WatchPAT one?  After you start the study, you should wear the device at least 6 hours.  How far should I keep my phone from the device?  During the night, your phone should be within 15 feet.  What happens if I leave the room for restroom or other reasons?  Leaving the room for any reason will not cause any problem. As soon as your get back to the room, both devices will reconnect and will continue to send the sleep data. Can I use my phone during the sleep study?  Yes, you can use your phone as usual during the study. But it is recommended to put your watchpat one on when you are ready to go to bed.  How will I get my study results?  A soon as you completed your study, your sleep data will be sent to the provider. They will then share the results with you when they are ready.    Follow-Up: At [facility], you and your health needs are our priority.  As part of our continuing mission to provide you with exceptional heart care, our providers are all part of one team.  This team includes your primary Cardiologist (physician) and Advanced Practice Providers or APPs (Physician Assistants and Nurse Practitioners) who all work together to  provide you with the care you need, when you need it.  Your next appointment:   2 month(s)  Provider:   Dr Ladona  We recommend signing up for the patient portal called MyChart.  Sign up information is provided on this After Visit Summary.  MyChart is used to connect with patients for Virtual Visits (Telemedicine).  Patients are able to view lab/test results, encounter notes, upcoming appointments, etc.  Non-urgent messages can be sent to your provider as well.   To learn more about what you can do with MyChart, go to [email or web address].

## 2024-07-18 ENCOUNTER — Ambulatory Visit: Admitting: Cardiology
